# Patient Record
Sex: Female | Born: 1965 | Race: White | Hispanic: No | Marital: Single | Smoking: Never smoker
Health system: Southern US, Community
[De-identification: ages and names within clinical notes are randomized; demographics above are authoritative.]

## PROBLEM LIST (undated history)

## (undated) DIAGNOSIS — F0390 Unspecified dementia without behavioral disturbance: Secondary | ICD-10-CM

## (undated) DIAGNOSIS — Q369 Cleft lip, unilateral: Secondary | ICD-10-CM

## (undated) DIAGNOSIS — T8859XA Other complications of anesthesia, initial encounter: Secondary | ICD-10-CM

## (undated) DIAGNOSIS — N939 Abnormal uterine and vaginal bleeding, unspecified: Secondary | ICD-10-CM

## (undated) DIAGNOSIS — N926 Irregular menstruation, unspecified: Secondary | ICD-10-CM

## (undated) DIAGNOSIS — F32A Depression, unspecified: Secondary | ICD-10-CM

## (undated) DIAGNOSIS — Q909 Down syndrome, unspecified: Secondary | ICD-10-CM

## (undated) DIAGNOSIS — R569 Unspecified convulsions: Secondary | ICD-10-CM

## (undated) DIAGNOSIS — F419 Anxiety disorder, unspecified: Secondary | ICD-10-CM

## (undated) DIAGNOSIS — F329 Major depressive disorder, single episode, unspecified: Secondary | ICD-10-CM

## (undated) DIAGNOSIS — G40822 Epileptic spasms, not intractable, without status epilepticus: Secondary | ICD-10-CM

## (undated) DIAGNOSIS — T4145XA Adverse effect of unspecified anesthetic, initial encounter: Secondary | ICD-10-CM

## (undated) HISTORY — DX: Depression, unspecified: F32.A

## (undated) HISTORY — DX: Major depressive disorder, single episode, unspecified: F32.9

## (undated) HISTORY — DX: Irregular menstruation, unspecified: N92.6

## (undated) HISTORY — DX: Abnormal uterine and vaginal bleeding, unspecified: N93.9

## (undated) HISTORY — PX: EAR EXAMINATION UNDER ANESTHESIA: SHX1482

## (undated) HISTORY — PX: TOOTH EXTRACTION: SUR596

## (undated) HISTORY — DX: Down syndrome, unspecified: Q90.9

## (undated) HISTORY — DX: Epileptic spasms, not intractable, without status epilepticus: G40.822

## (undated) HISTORY — DX: Anxiety disorder, unspecified: F41.9

## (undated) HISTORY — DX: Cleft lip, unilateral: Q36.9

---

## 2004-10-04 ENCOUNTER — Ambulatory Visit: Payer: Self-pay | Admitting: Dentistry

## 2006-02-19 ENCOUNTER — Ambulatory Visit: Payer: Self-pay | Admitting: Family Medicine

## 2011-08-04 ENCOUNTER — Ambulatory Visit: Payer: Self-pay | Admitting: Anesthesiology

## 2011-08-05 ENCOUNTER — Ambulatory Visit: Payer: Self-pay | Admitting: Otolaryngology

## 2012-04-22 ENCOUNTER — Telehealth: Payer: Self-pay

## 2012-04-22 NOTE — Telephone Encounter (Signed)
Per protocol, I called in one refill on Medroxyprogesterone  10 mg tabs # 30 with 0 RF til pt is seen for AEX . Melody Comas A

## 2012-05-20 ENCOUNTER — Telehealth: Payer: Self-pay | Admitting: Obstetrics and Gynecology

## 2012-05-20 NOTE — Telephone Encounter (Signed)
Could not leave msg on number in chart . Seems like a fax number. Also tried to call other number in her chart lm on vm to call back rgd her daughter's rx refill request from [facility] for medroxyprogesterone 10 m,g tab. Sig 1 po qd . Pt needs to make an aex

## 2012-05-31 ENCOUNTER — Telehealth: Payer: Self-pay | Admitting: Obstetrics and Gynecology

## 2012-05-31 NOTE — Telephone Encounter (Signed)
Spoke with pt's group home rgd rx refill request . Made an app with avs on 06/14/2012 @ 10:00. Per deborah approved rx refill and faxed back to the pharmacy. Medroxyprogesterone 10 mg sig 1 po qd with 1 refill. Pt 's group home aware of rx refill and voice understanding. bt cma

## 2012-06-14 ENCOUNTER — Encounter: Payer: Self-pay | Admitting: Obstetrics and Gynecology

## 2012-06-14 ENCOUNTER — Ambulatory Visit (INDEPENDENT_AMBULATORY_CARE_PROVIDER_SITE_OTHER): Payer: PRIVATE HEALTH INSURANCE | Admitting: Obstetrics and Gynecology

## 2012-06-14 VITALS — BP 102/74 | Resp 14 | Ht <= 58 in | Wt 106.0 lb

## 2012-06-14 DIAGNOSIS — Z01419 Encounter for gynecological examination (general) (routine) without abnormal findings: Secondary | ICD-10-CM

## 2012-06-14 DIAGNOSIS — Q909 Down syndrome, unspecified: Secondary | ICD-10-CM

## 2012-06-14 MED ORDER — MEDROXYPROGESTERONE ACETATE 10 MG PO TABS: 10.0000 mg | ORAL_TABLET | Freq: Every day | ORAL | Status: DC

## 2012-06-14 NOTE — Progress Notes (Signed)
Regular Periods: no Mammogram: no  Monthly Breast Ex.: yes Exercise: yes  Tetanus < 10 years: yes Seatbelts: yes  NI. Bladder Functn.: yes Abuse at home: no  Daily BM's: yes Stressful Work: no  Healthy Diet: yes Sigmoid-Colonoscopy: N/A  Calcium: yes Medical problems this year: N/A   LAST PAP:N/A  Contraception:  None               Mammogram:  N/A  PCP: Dr.Anderson  PMH: No Changes  FMH: No Changes  Last Bone Scan: Never.   Subjective:    Hannah Gardner is a 46 y.o. female, G0P0, who presents for an annual exam. See above. The patient has trisomy 34.  She is mentally challenged.  She takes Provera 10 mg each day to control her cycles.  Her caregivers reports that she has no sexual contact.  Prior Hysterectomy: No    History   Social History  . Marital Status: Single    Spouse Name: N/A    Number of Children: N/A  . Years of Education: N/A   Social History Main Topics  . Smoking status: Never Smoker   . Smokeless tobacco: Never Used  . Alcohol Use: No  . Drug Use: No  . Sexually Active: No   Other Topics Concern  . None   Social History Narrative  . None    Menstrual cycle:   LMP: No LMP recorded.           Cycle: irregular menses due to Provera.  The following portions of the patient's history were reviewed and updated as appropriate: allergies, current medications, past family history, past medical history, past social history, past surgical history and problem list.  Review of Systems Pertinent items are noted in HPI. Breast:Negative for breast lump,nipple discharge or nipple retraction Gastrointestinal: Negative for abdominal pain, change in bowel habits or rectal bleeding Urinary:negative   Objective:    BP 102/74  Resp 14  Ht 4' 7.5" (1.41 m)  Wt 106 lb (48.081 kg)  BMI 24.19 kg/m2    Weight:  Wt Readings from Last 1 Encounters:  06/14/12 106 lb (48.081 kg)          BMI: Body mass index is 24.19 kg/(m^2).  General Appearance: Alert,  appropriate appearance for age. No acute distress HEENT: Grossly normal Neck / Thyroid: Supple, no masses, nodes or enlargement Lungs: clear to auscultation bilaterally Back: No CVA tenderness Breast Exam: No masses or nodes.No dimpling, nipple retraction or discharge. Cardiovascular: Regular rate and rhythm. S1, S2, no murmur Gastrointestinal: Soft, non-tender, no masses or organomegaly  ++++++++++++++++++++++++++++++++++++++++++++++++++++++++  Pelvic Exam: Exam deferred. the patient is mentally challenged and she would not allow [place] to do a pelvic exam. We tried on several occasions. Rectovaginal: the patient is mentally challenged and she would not allow [place] to do a rectal exam in spite of our best efforts.  ++++++++++++++++++++++++++++++++++++++++++++++++++++++++  Lymphatic Exam: Non-palpable nodes in neck, clavicular, axillary, or inguinal regions   Assessment:    trisomy 13   Uncooperative with exam  Needs control of menses for sanitation reasons  Overweight or obese: Yes     Plan:    return annually or prn Appropriate forms completed. Contraception:abstinence    STD screen request: No   The updated Pap smear screening guidelines were discussed with the patient. The patient requested that I obtain a Pap smear: No.  Kegel exercises discussed: No.  Provera 10 mg each day.  Mylinda Latina.D.

## 2013-08-15 ENCOUNTER — Emergency Department: Payer: Self-pay | Admitting: Emergency Medicine

## 2014-04-05 DIAGNOSIS — Q909 Down syndrome, unspecified: Secondary | ICD-10-CM | POA: Insufficient documentation

## 2014-04-05 DIAGNOSIS — G309 Alzheimer's disease, unspecified: Secondary | ICD-10-CM

## 2014-04-05 DIAGNOSIS — F0280 Dementia in other diseases classified elsewhere without behavioral disturbance: Secondary | ICD-10-CM | POA: Insufficient documentation

## 2014-05-15 DIAGNOSIS — B351 Tinea unguium: Secondary | ICD-10-CM | POA: Insufficient documentation

## 2014-06-25 ENCOUNTER — Emergency Department: Payer: Self-pay | Admitting: Emergency Medicine

## 2014-06-25 LAB — COMPREHENSIVE METABOLIC PANEL
ALBUMIN: 3.1 g/dL — AB (ref 3.4–5.0)
Alkaline Phosphatase: 79 U/L
Anion Gap: 7 (ref 7–16)
BILIRUBIN TOTAL: 0.2 mg/dL (ref 0.2–1.0)
BUN: 19 mg/dL — ABNORMAL HIGH (ref 7–18)
CALCIUM: 8.3 mg/dL — AB (ref 8.5–10.1)
CREATININE: 1.44 mg/dL — AB (ref 0.60–1.30)
Chloride: 106 mmol/L (ref 98–107)
Co2: 28 mmol/L (ref 21–32)
EGFR (Non-African Amer.): 43 — ABNORMAL LOW
GFR CALC AF AMER: 50 — AB
GLUCOSE: 101 mg/dL — AB (ref 65–99)
Osmolality: 284 (ref 275–301)
POTASSIUM: 4.3 mmol/L (ref 3.5–5.1)
SGOT(AST): 29 U/L (ref 15–37)
SGPT (ALT): 16 U/L
SODIUM: 141 mmol/L (ref 136–145)
TOTAL PROTEIN: 7.4 g/dL (ref 6.4–8.2)

## 2014-06-25 LAB — CBC
HCT: 41.5 % (ref 35.0–47.0)
HGB: 13.6 g/dL (ref 12.0–16.0)
MCH: 33.1 pg (ref 26.0–34.0)
MCHC: 32.7 g/dL (ref 32.0–36.0)
MCV: 101 fL — AB (ref 80–100)
PLATELETS: 250 10*3/uL (ref 150–440)
RBC: 4.1 10*6/uL (ref 3.80–5.20)
RDW: 13 % (ref 11.5–14.5)
WBC: 6.8 10*3/uL (ref 3.6–11.0)

## 2014-11-13 ENCOUNTER — Emergency Department: Payer: Self-pay | Admitting: Emergency Medicine

## 2014-11-23 DIAGNOSIS — R262 Difficulty in walking, not elsewhere classified: Secondary | ICD-10-CM | POA: Insufficient documentation

## 2014-11-23 DIAGNOSIS — R569 Unspecified convulsions: Secondary | ICD-10-CM | POA: Insufficient documentation

## 2014-11-23 DIAGNOSIS — G479 Sleep disorder, unspecified: Secondary | ICD-10-CM | POA: Insufficient documentation

## 2015-04-05 ENCOUNTER — Inpatient Hospital Stay: Admission: RE | Admit: 2015-04-05 | Payer: Self-pay | Source: Ambulatory Visit

## 2015-04-06 DIAGNOSIS — Z79899 Other long term (current) drug therapy: Secondary | ICD-10-CM | POA: Diagnosis not present

## 2015-04-06 DIAGNOSIS — Q909 Down syndrome, unspecified: Secondary | ICD-10-CM | POA: Diagnosis not present

## 2015-04-06 DIAGNOSIS — H6123 Impacted cerumen, bilateral: Secondary | ICD-10-CM | POA: Diagnosis not present

## 2015-04-06 NOTE — Patient Instructions (Addendum)
  Your procedure is scheduled on: April 11, 2015 Report to Same Day Surgery at 7:30 am.   Remember: Instructions that are not followed completely may result in serious medical risk, up to and including death, or upon the discretion of your surgeon and anesthesiologist your surgery may need to be rescheduled.    __x__ 1. Do not eat food or drink liquids after midnight. No gum chewing or hard candies.     ____ 2. No Alcohol for 24 hours before or after surgery.   ____ 3. Bring all medications with you on the day of surgery if instructed.    __x__ 4. Notify your doctor if there is any change in your medical condition     (cold, fever, infections).     Do not wear jewelry, make-up, hairpins, clips or nail polish.  Do not wear lotions, powders, or perfumes. You may wear deodorant.  Do not shave 48 hours prior to surgery. Men may shave face and neck.  Do not bring valuables to the hospital.    [facility] is not responsible for any belongings or valuables.               Contacts, dentures or bridgework may not be worn into surgery.  Leave your suitcase in the car. After surgery it may be brought to your room.  For patients admitted to the hospital, discharge time is determined by your                treatment team.   Patients discharged the day of surgery will not be allowed to drive home.   Please read over the following fact sheets that you were given:      __x_ Take these medicines the morning of surgery with A SIP OF WATER:    1. Ativan  ____ Fleet Enema (as directed)   ____ Use CHG Soap as directed  ____ Use inhalers on the day of surgery  ____ Stop metformin 2 days prior to surgery    ____ Take 1/2 of usual insulin dose the night before surgery and none on the morning of surgery.   ____ Stop Coumadin/Plavix/aspirin on does not apply.  ____ Stop Anti-inflammatories on Does not apply.   ____ Stop supplements until after surgery.    ____ Bring C-Pap to the hospital.

## 2015-04-06 NOTE — Pre-Procedure Instructions (Signed)
This RN spoke with Hannah Gardner at [facility] regarding pt's preop instructions and faxed a copy of said instructions to [facility].

## 2015-04-11 ENCOUNTER — Encounter
Admission: RE | Disposition: A | Discharge status: Other Acute Inpatient Hospital | Payer: Self-pay | Source: Ambulatory Visit | Attending: Otolaryngology

## 2015-04-11 SURGERY — REMOVAL, CERUMEN, IMPACTED
Anesthesia: General | Laterality: Bilateral

## 2015-04-18 ENCOUNTER — Ambulatory Visit: Admission: RE | Admit: 2015-04-18 | Payer: Medicare Other | Source: Ambulatory Visit | Admitting: Otolaryngology

## 2015-04-18 ENCOUNTER — Encounter: Payer: Self-pay | Admitting: *Deleted

## 2015-04-18 ENCOUNTER — Encounter
Admission: RE | Disposition: A | Discharge status: Other Acute Inpatient Hospital | Payer: Self-pay | Source: Ambulatory Visit | Attending: Otolaryngology

## 2015-04-18 ENCOUNTER — Ambulatory Visit
Admission: RE | Admit: 2015-04-18 | Discharge: 2015-04-18 | Payer: Medicare Other | Source: Ambulatory Visit | Attending: Otolaryngology | Admitting: Otolaryngology

## 2015-04-18 DIAGNOSIS — H6123 Impacted cerumen, bilateral: Secondary | ICD-10-CM | POA: Insufficient documentation

## 2015-04-18 DIAGNOSIS — Z79899 Other long term (current) drug therapy: Secondary | ICD-10-CM | POA: Insufficient documentation

## 2015-04-18 DIAGNOSIS — Q909 Down syndrome, unspecified: Secondary | ICD-10-CM | POA: Insufficient documentation

## 2015-04-18 HISTORY — PX: CERUMEN REMOVAL: SHX6571

## 2015-04-18 HISTORY — DX: Adverse effect of unspecified anesthetic, initial encounter: T41.45XA

## 2015-04-18 HISTORY — DX: Other complications of anesthesia, initial encounter: T88.59XA

## 2015-04-18 SURGERY — REMOVAL, CERUMEN, IMPACTED
Anesthesia: General | Laterality: Bilateral | Wound class: Clean Contaminated

## 2015-04-18 MED ORDER — MIDAZOLAM HCL 2 MG/ML PO SYRP
10.0000 mg | ORAL_SOLUTION | Freq: Once | ORAL | Status: AC
  Administered 2015-04-18: 10 mg via ORAL

## 2015-04-18 MED ORDER — DEXAMETHASONE SODIUM PHOSPHATE 4 MG/ML IJ SOLN
8.0000 mg | Freq: Once | INTRAMUSCULAR | Status: DC | PRN
  Filled 2015-04-18: qty 2

## 2015-04-18 MED ORDER — FENTANYL CITRATE (PF) 100 MCG/2ML IJ SOLN: 25.0000 ug | INTRAMUSCULAR | Status: DC | PRN

## 2015-04-18 SURGICAL SUPPLY — 7 items
BLADE MYR LANCE NRW W/HDL (BLADE) IMPLANT
CANISTER SUCT 1200ML W/VALVE (MISCELLANEOUS) ×3 IMPLANT
COTTONBALL LRG STERILE PKG (GAUZE/BANDAGES/DRESSINGS) IMPLANT
GLOVE BIO SURGEON STRL SZ7.5 (GLOVE) ×6 IMPLANT
TOWEL OR 17X26 4PK STRL BLUE (TOWEL DISPOSABLE) ×3 IMPLANT
TUBING CONN 6MMX3.1M (TUBING) ×2
TUBING SUCTION CONN 0.25 STRL (TUBING) ×1 IMPLANT

## 2015-04-18 NOTE — Anesthesia Preprocedure Evaluation (Signed)
Anesthesia Evaluation  Patient identified by MRN, date of birth, ID band Patient awake  General Assessment Comment:Mentally challenged and uncooperative. Hx from family/caregivers. They report no problems with prior anesthetics. NKDA, NPO.  Reviewed: Allergy & Precautions, NPO status , Patient's Chart, lab work & pertinent test results  Airway Mallampati: II  TM Distance: >3 FB Neck ROM: Full    Dental  (+) Teeth Intact   Pulmonary          Cardiovascular     Neuro/Psych    GI/Hepatic   Endo/Other    Renal/GU      Musculoskeletal   Abdominal (+)  Abdomen: soft.    Peds  Hematology   Anesthesia Other Findings   Reproductive/Obstetrics On provera.                             Anesthesia Physical Anesthesia Plan  ASA: III  Anesthesia Plan: General   Post-op Pain Management:    Induction: Inhalational  Airway Management Planned: Mask  Additional Equipment:   Intra-op Plan:   Post-operative Plan:   Informed Consent: I have reviewed the patients History and Physical, chart, labs and discussed the procedure including the risks, benefits and alternatives for the proposed anesthesia with the patient or authorized representative who has indicated his/her understanding and acceptance.     Plan Discussed with: CRNA  Anesthesia Plan Comments:         Anesthesia Quick Evaluation

## 2015-04-18 NOTE — Anesthesia Postprocedure Evaluation (Signed)
  Anesthesia Post-op Note  Patient: Hannah Gardner  Procedure(s) Performed: Procedure(s): CERUMEN REMOVAL, Bilateral  (Bilateral)  Anesthesia type:General  Patient location: PACU  Post pain: Pain level controlled  Post assessment: Post-op Vital signs reviewed, Patient's Cardiovascular Status Stable, Respiratory Function Stable, Patent Airway and No signs of Nausea or vomiting  Post vital signs: Reviewed and stable  Last Vitals:  Filed Vitals:   04/18/15 1057  BP:   Pulse:   Temp: 36.1 C  Resp:     Level of consciousness: awake, alert  and patient cooperative  Complications: No apparent anesthesia complications

## 2015-04-18 NOTE — Transfer of Care (Signed)
Immediate Anesthesia Transfer of Care Note  Patient: Hannah Gardner  Procedure(s) Performed: Procedure(s): CERUMEN REMOVAL, Bilateral  (Bilateral)  Patient Location: PACU  Anesthesia Type:General  Level of Consciousness: sedated and responds to stimulation  Airway & Oxygen Therapy: Patient Spontanous Breathing and Patient connected to face mask oxygen  Post-op Assessment: Report given to RN and Post -op Vital signs reviewed and stable  Post vital signs: Reviewed and stable  Last Vitals:  Filed Vitals:   04/18/15 0911  BP: 106/62  Pulse: 78  Temp: 37.1 C  Resp: 16    Complications: No apparent anesthesia complications

## 2015-04-18 NOTE — Op Note (Signed)
04/18/2015  10:47 AM    Montey HoraSusan Bhatt  [id number]   Pre-Op Diagnosis:  IMPACTED CERUMEN Post-op Diagnosis: IMPACTED CERUMEN  Procedure: Bilateral removal of impacted cerumen under anesthesia Surgeon:  Sandi MealyBennett, Batool Majid S  Anesthesia:  General anesthesia with masked ventilation  EBL:  Minimal  Complications:  None  Findings: Bilateral impacted cerumen. TM's clear.  Procedure: The patient was taken to the Operating Room and placed in the supine position.  After induction of general anesthesia with mask ventilation, the right ear was evaluated under the operating microscope and the canal cleaned of impacted cerumen with a currette. The findings were as described above, the TM was normal.  Attention was then turned to the left ear. The same procedure was then performed on this side in the same fashion.  The patient was then returned to the anesthesiologist for awakening, and was taken to the Recovery Room in stable condition.  Cultures:  None.  Disposition:   PACU then discharge home  Plan: Follow up as needed   Sandi MealyBennett, Shaniya Tashiro S 04/18/2015 10:47 AM

## 2015-04-18 NOTE — H&P (Signed)
History and physical reviewed and will be scanned in later. No change in medical status reported by the patient or family, appears stable for surgery. All questions regarding the procedure answered, and patient (or family if a child) expressed understanding of the procedure.  Hannah Gardner @TODAY@ 

## 2015-04-18 NOTE — Discharge Instructions (Signed)
AMBULATORY SURGERY  °DISCHARGE INSTRUCTIONS ° ° °1) The drugs that you were given will stay in your system until tomorrow so for the next 24 hours you should not: ° °A) Drive an automobile °B) Make any legal decisions °C) Drink any alcoholic beverage ° ° °2) You may resume regular meals tomorrow.  Today it is better to start with liquids and gradually work up to solid foods. ° °You may eat anything you prefer, but it is better to start with liquids, then soup and crackers, and gradually work up to solid foods. ° ° °3) Please notify your doctor immediately if you have any unusual bleeding, trouble breathing, redness and pain at the surgery site, drainage, fever, or pain not relieved by medication. ° °4) Your post-operative visit with Dr.                     °           °     is: Date:                        Time:   ° °Please call to schedule your post-operative visit. ° °5) Additional Instructions: °6)  °

## 2015-04-21 ENCOUNTER — Emergency Department
Admission: EM | Admit: 2015-04-21 | Discharge: 2015-04-22 | Disposition: A | Discharge status: Home / Self Care | Payer: Medicare Other | Attending: Emergency Medicine | Admitting: Emergency Medicine

## 2015-04-21 DIAGNOSIS — R569 Unspecified convulsions: Secondary | ICD-10-CM | POA: Diagnosis not present

## 2015-04-21 DIAGNOSIS — F0390 Unspecified dementia without behavioral disturbance: Secondary | ICD-10-CM | POA: Diagnosis not present

## 2015-04-21 DIAGNOSIS — Q909 Down syndrome, unspecified: Secondary | ICD-10-CM | POA: Diagnosis not present

## 2015-04-21 DIAGNOSIS — N3000 Acute cystitis without hematuria: Secondary | ICD-10-CM | POA: Diagnosis not present

## 2015-04-21 DIAGNOSIS — Z79899 Other long term (current) drug therapy: Secondary | ICD-10-CM | POA: Insufficient documentation

## 2015-04-21 LAB — CBC
HEMATOCRIT: 36.9 % (ref 35.0–47.0)
HEMOGLOBIN: 12.4 g/dL (ref 12.0–16.0)
MCH: 33.5 pg (ref 26.0–34.0)
MCHC: 33.6 g/dL (ref 32.0–36.0)
MCV: 99.7 fL (ref 80.0–100.0)
PLATELETS: 229 10*3/uL (ref 150–440)
RBC: 3.7 MIL/uL — AB (ref 3.80–5.20)
RDW: 13.3 % (ref 11.5–14.5)
WBC: 5.7 10*3/uL (ref 3.6–11.0)

## 2015-04-21 LAB — URINALYSIS COMPLETE WITH MICROSCOPIC (ARMC ONLY)
Bacteria, UA: NONE SEEN
Bilirubin Urine: NEGATIVE
Glucose, UA: NEGATIVE mg/dL
HGB URINE DIPSTICK: NEGATIVE
Ketones, ur: NEGATIVE mg/dL
Nitrite: NEGATIVE
PH: 7 (ref 5.0–8.0)
PROTEIN: NEGATIVE mg/dL
Specific Gravity, Urine: 1.002 — ABNORMAL LOW (ref 1.005–1.030)

## 2015-04-21 MED ORDER — LEVETIRACETAM 500 MG PO TABS
ORAL_TABLET | ORAL | Status: AC
Start: 2015-04-21 — End: 2015-04-21
  Administered 2015-04-21: 1000 mg via ORAL
  Filled 2015-04-21: qty 2

## 2015-04-21 MED ORDER — LORAZEPAM 1 MG PO TABS
ORAL_TABLET | ORAL | Status: AC
  Administered 2015-04-21: 1 mg via ORAL
  Filled 2015-04-21: qty 1

## 2015-04-21 MED ORDER — LEVETIRACETAM 500 MG PO TABS: 500.0000 mg | ORAL_TABLET | Freq: Two times a day (BID) | ORAL | Status: DC

## 2015-04-21 MED ORDER — KETAMINE HCL 50 MG/ML IJ SOLN: 40.0000 mg | Freq: Once | INTRAMUSCULAR | Status: DC

## 2015-04-21 MED ORDER — KETAMINE HCL 50 MG/ML IJ SOLN
INTRAMUSCULAR | Status: AC
  Filled 2015-04-21: qty 10

## 2015-04-21 MED ORDER — LORAZEPAM 1 MG PO TABS
1.0000 mg | ORAL_TABLET | Freq: Once | ORAL | Status: AC
  Administered 2015-04-21: 1 mg via ORAL

## 2015-04-21 MED ORDER — LEVETIRACETAM 500 MG PO TABS
1000.0000 mg | ORAL_TABLET | Freq: Once | ORAL | Status: AC
  Administered 2015-04-21: 1000 mg via ORAL

## 2015-04-21 NOTE — ED Notes (Signed)
Consent signed by mother

## 2015-04-21 NOTE — ED Notes (Signed)
Pt to ED from [facility] with mother and caregiver c/o seizures.  Pt has hx of down syndrome and dementia.  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  Caregiver states pt had 3 minutes seizure around 1830 at group home, reported that pt head turned to the side and fell sideways onto couch with muscle shaking activity.  Reported that pt was more agitated post seizure.  According to caregiver pt is acting at baseline now.  Caregiver states pt not showing signs of pain.

## 2015-04-21 NOTE — ED Notes (Signed)
Pt presents after having a seizure that lasted approx 3 minutes. Pt is currently being evaluated by neurology and no definite dx at this time. Unable to obtain vitals due to pt agitation. Per Lum Keas, RN consultant for pt's home, pt is usually premedicated prior to MD appts. No medication given prior to arrival.

## 2015-04-21 NOTE — ED Provider Notes (Signed)
[facility] Emergency Department Provider Note  Time seen: 9:36 PM  I have reviewed the triage vital signs and the nursing notes.   HISTORY  Chief Complaint Seizures    HPI Hannah Gardner is a 49 y.o. female with a past medical history of Down syndrome, dementia who presents the emergency department with a possible seizure. According to mom and a group home caretaker the patient had a history of seizures as a child, but outgrew them. However in the past 1 year this is now the third seizure the patient has had. The patient has a history of dementia and Down's syndrome which per mom has been making her neurologic workup very difficult as the patient does not tolerate medical procedures well. She had to go under general anesthesia last week to have the earwax removed from her ear. Patient cannot provide her own history. Patient was treated with Ativan prior to arrival, to help attempt to keep her calm. Patient is not currently on any seizure medications.     Past Medical History  Diagnosis Date  . Irregular periods/menstrual cycles   . Down's syndrome   . Cleft lip   . Hypsarrhythmia   . Complication of anesthesia     pt was given 2 shots for sedation for a past surgery that made pt aggressive.  Mom went back with pt for last surgery, pt did well.    Patient Active Problem List   Diagnosis Date Noted  . 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 Ear examination under anesthesia Bilateral     remove cerumen from ears  . Cerumen removal Bilateral 04/18/2015    Procedure: CERUMEN REMOVAL, Bilateral ;  Surgeon: Geanie Logan, MD;  Location: [facility] ORS;  Service: ENT;  Laterality: Bilateral;    Current Outpatient Rx  Name  Route  Sig  Dispense  Refill  . acetaminophen (TYLENOL) 325 MG tablet   Oral   Take 325 mg by mouth every 4 (four) hours as needed for mild pain or fever.         Marland Kitchen aluminum-magnesium hydroxide-simethicone  (MAALOX) 200-200-20 MG/5ML SUSP   Oral   Take 30 mLs by mouth every 4 (four) hours as needed (upset stomach).         . Calcium Carb-Cholecalciferol (CALCIUM 600 + D PO)   Oral   Take 1 tablet by mouth daily.         . diphenhydrAMINE (BENADRYL) 25 mg capsule   Oral   Take 25 mg by mouth every 4 (four) hours as needed for allergies.         Marland Kitchen donepezil (ARICEPT) 5 MG tablet   Oral   Take 5 mg by mouth 2 (two) times daily.          Marland Kitchen econazole nitrate 1 % cream   Topical   Apply 1 application topically as needed.         Marland Kitchen escitalopram (LEXAPRO) 10 MG tablet   Oral   Take 10 mg by mouth 2 (two) times daily.         . hydrocortisone 2.5 % cream   Topical   Apply 1 application topically as needed.         . hydrogen peroxide 1.5 % SOLN   Topical   Apply 4 drops topically as needed (to clean ears).         . loperamide (IMODIUM) 2 MG capsule   Oral  Take 4 mg by mouth as needed for diarrhea or loose stools.         Marland Kitchen LORazepam (ATIVAN) 0.5 MG tablet   Oral   Take 0.5 mg by mouth every 6 (six) hours as needed for anxiety (aggitation, transportation).          . LORazepam (ATIVAN) 1 MG tablet   Oral   Take 1 mg by mouth as needed (testing, MD visits, nail care).          . magnesium hydroxide (MILK OF MAGNESIA) 400 MG/5ML suspension   Oral   Take 30 mLs by mouth daily as needed for mild constipation or moderate constipation.         . Memantine HCl-Donepezil HCl 28-10 MG CP24   Oral   Take 1 tablet by mouth daily.         . Multiple Vitamin (MULTIVITAMIN WITH MINERALS) TABS tablet   Oral   Take 1 tablet by mouth daily.         . NON FORMULARY   Topical   Apply 1 application topically daily as needed (dry skin). DML forte cream         . senna (SENOKOT) 8.6 MG tablet   Oral   Take 2 tablets by mouth as needed for constipation.         . traZODone (DESYREL) 50 MG tablet   Oral   Take 50 mg by mouth at bedtime as needed for  sleep.           Allergies Review of patient's allergies indicates no known allergies.  No family history on file.  Social History History  Substance Use Topics  . Smoking status: Never Smoker   . Smokeless tobacco: Never Used  . Alcohol Use: No    Review of Systems Unable to obtain a review systems due to dementia and Down's syndrome.  ____________________________________________   PHYSICAL EXAM:  VITAL SIGNS: ED Triage Vitals  Enc Vitals Group     BP --      Pulse --      Resp --      Temp --      Temp src --      SpO2 --      Weight --      Height --      Head Cir --      Peak Flow --      Pain Score --      Pain Loc --      Pain Edu? --      Excl. in GC? --     Constitutional: Alert and oriented. Well appearing and in no distress. ENT   Head: Normocephalic and atraumatic.   Mouth/Throat: Mucous membranes are moist. Cardiovascular: Normal rate, regular rhythm.  Respiratory: Normal respiratory effort without tachypnea nor retractions. Breath sounds are clear  Gastrointestinal: Soft and nontender. No distention.  Musculoskeletal: Nontender with normal range of motion in all extremities. Neurologic:  Normal speech and language. No gross focal neurologic deficits Psychiatric: Mood and affect are normal. Speech and behavior are normal.   ____________________________________________    INITIAL IMPRESSION / ASSESSMENT AND PLAN / ED COURSE  Pertinent labs & imaging results that were available during my care of the patient were reviewed by me and considered in my medical decision making (see chart for details).  Patient presents with a likely 3 minute tonic-clonic seizure at her group home. Per mom this is the patient's third seizure in the  past 1 year. Patient appears well currently, and at her baseline per mom. We will check labs in the emergency department. I discussed this with mom who is agreeable, although she states the patient will fight  "ferociously." They will attempt to have the patient urinate in the hat in the toilet. If the patient is unable to urinate, and will require an in and out catheterization we will likely proceed with a conscious sedation to obtain medical workup.  Patient unable to urinate on her own. I discussed the options with mom, they have decided that they would rather physically restrain the patient to obtain an in and out urinalysis and blood work instead of IM sedation. We will obtain labs, and urinalysis to further workup/evaluate. I discussed with Dr. Doran Clay.  He recommends starting the patient on Keppra as long as her creatinine is within normal limits, he recommends starting 1000 mg load in the department and to be discharged on 500 mg tablet by mouth twice a day.  ____________________________________________   FINAL CLINICAL IMPRESSION(S) / ED DIAGNOSES  Seizure   Minna Antis, MD 04/21/15 [phone]

## 2015-04-22 DIAGNOSIS — R569 Unspecified convulsions: Secondary | ICD-10-CM | POA: Diagnosis not present

## 2015-04-22 LAB — COMPREHENSIVE METABOLIC PANEL
ALBUMIN: 3 g/dL — AB (ref 3.5–5.0)
ALK PHOS: 82 U/L (ref 38–126)
ALT: 12 U/L — ABNORMAL LOW (ref 14–54)
AST: 22 U/L (ref 15–41)
Anion gap: 6 (ref 5–15)
BILIRUBIN TOTAL: 0.3 mg/dL (ref 0.3–1.2)
BUN: 15 mg/dL (ref 6–20)
CO2: 30 mmol/L (ref 22–32)
Calcium: 8.5 mg/dL — ABNORMAL LOW (ref 8.9–10.3)
Chloride: 102 mmol/L (ref 101–111)
Creatinine, Ser: 0.94 mg/dL (ref 0.44–1.00)
GLUCOSE: 85 mg/dL (ref 65–99)
Potassium: 3.9 mmol/L (ref 3.5–5.1)
Sodium: 138 mmol/L (ref 135–145)
Total Protein: 6.6 g/dL (ref 6.5–8.1)

## 2015-04-22 MED ORDER — SULFAMETHOXAZOLE-TRIMETHOPRIM 800-160 MG PO TABS: 1.0000 | ORAL_TABLET | Freq: Two times a day (BID) | ORAL | Status: DC

## 2015-04-22 MED ORDER — SULFAMETHOXAZOLE-TRIMETHOPRIM 800-160 MG PO TABS
1.0000 | ORAL_TABLET | Freq: Once | ORAL | Status: AC
  Administered 2015-04-22: 1 via ORAL

## 2015-04-22 MED ORDER — SULFAMETHOXAZOLE-TRIMETHOPRIM 800-160 MG PO TABS
ORAL_TABLET | ORAL | Status: AC
  Administered 2015-04-22: 1 via ORAL
  Filled 2015-04-22: qty 1

## 2015-04-22 NOTE — Discharge Instructions (Signed)
1. Start Keppra 500 mg twice daily for seizures (#60). 2. Take antibiotics as prescribed for UTI (Septra DS twice daily 7 days). 3. Return to the ER for recurrent or worsening symptoms, fever, persistent vomiting or other concerns.  Epilepsy Epilepsy is a disorder in which a person has repeated seizures over time. A seizure is a release of abnormal electrical activity in the brain. Seizures can cause a change in attention, behavior, or the ability to remain awake and alert (altered mental status). Seizures often involve uncontrollable shaking (convulsions).  Most people with epilepsy lead normal lives. However, people with epilepsy are at an increased risk of falls, accidents, and injuries. Therefore, it is important to begin treatment right away. CAUSES  Epilepsy has many possible causes. Anything that disturbs the normal pattern of brain cell activity can lead to seizures. This may include:   Head injury.  Birth trauma.  High fever as a child.  Stroke.  Bleeding into or around the brain.  Certain drugs.  Prolonged low oxygen, such as what occurs after CPR efforts.  Abnormal brain development.  Certain illnesses, such as meningitis, encephalitis (brain infection), malaria, and other infections.  An imbalance of nerve signaling chemicals (neurotransmitters).  SIGNS AND SYMPTOMS  The symptoms of a seizure can vary greatly from one person to another. Right before a seizure, you may have a warning (aura) that a seizure is about to occur. An aura may include the following symptoms:  Fear or anxiety.  Nausea.  Feeling like the room is spinning (vertigo).  Vision changes, such as seeing flashing lights or spots. Common symptoms during a seizure include:  Abnormal sensations, such as an abnormal smell or a bitter taste in the mouth.   Sudden, general body stiffness.   Convulsions that involve rhythmic jerking of the face, arm, or leg on one or both sides.   Sudden change  in consciousness.   Appearing to be awake but not responding.   Appearing to be asleep but cannot be awakened.   Grimacing, chewing, lip smacking, drooling, tongue biting, or loss of bowel or bladder control. After a seizure, you may feel sleepy for a while. DIAGNOSIS  Your health care provider will ask about your symptoms and take a medical history. Descriptions from any witnesses to your seizures will be very helpful in the diagnosis. A physical exam, including a detailed neurological exam, is necessary. Various tests may be done, such as:   An electroencephalogram (EEG). This is a painless test of your brain waves. In this test, a diagram is created of your brain waves. These diagrams can be interpreted by a specialist.  An MRI of the brain.   A CT scan of the brain.   A spinal tap (lumbar puncture, LP).  Blood tests to check for signs of infection or abnormal blood chemistry. TREATMENT  There is no cure for epilepsy, but it is generally treatable. Once epilepsy is diagnosed, it is important to begin treatment as soon as possible. For most people with epilepsy, seizures can be controlled with medicines. The following may also be used:  A pacemaker for the brain (vagus nerve stimulator) can be used for people with seizures that are not well controlled by medicine.  Surgery on the brain. For some people, epilepsy eventually goes away. HOME CARE INSTRUCTIONS   Follow your health care provider's recommendations on driving and safety in normal activities.  Get enough rest. Lack of sleep can cause seizures.  Only take over-the-counter or prescription medicines as  directed by your health care provider. Take any prescribed medicine exactly as directed.  Avoid any known triggers of your seizures.  Keep a seizure diary. Record what you recall about any seizure, especially any possible trigger.   Make sure the people you live and work with know that you are prone to seizures.  They should receive instructions on how to help you. In general, a witness to a seizure should:   Cushion your head and body.   Turn you on your side.   Avoid unnecessarily restraining you.   Not place anything inside your mouth.   Call for emergency medical help if there is any question about what has occurred.   Follow up with your health care provider as directed. You may need regular blood tests to monitor the levels of your medicine.  SEEK MEDICAL CARE IF:   You develop signs of infection or other illness. This might increase the risk of a seizure.   You seem to be having more frequent seizures.   Your seizure pattern is changing.  SEEK IMMEDIATE MEDICAL CARE IF:   You have a seizure that does not stop after a few moments.   You have a seizure that causes any difficulty in breathing.   You have a seizure that results in a very severe headache.   You have a seizure that leaves you with the inability to speak or use a part of your body.  Document Released: 10/27/2005 Document Revised: 08/17/2013 Document Reviewed: 06/08/2013 [facility] Patient Information 2015 [place], [place]. This information is not intended to replace advice given to you by your health care provider. Make sure you discuss any questions you have with your health care provider.  Urinary Tract Infection Urinary tract infections (UTIs) can develop anywhere along your urinary tract. Your urinary tract is your body's drainage system for removing wastes and extra water. Your urinary tract includes two kidneys, two ureters, a bladder, and a urethra. Your kidneys are a pair of bean-shaped organs. Each kidney is about the size of your fist. They are located below your ribs, one on each side of your spine. CAUSES Infections are caused by microbes, which are microscopic organisms, including fungi, viruses, and bacteria. These organisms are so small that they can only be seen through a microscope. Bacteria are  the microbes that most commonly cause UTIs. SYMPTOMS  Symptoms of UTIs may vary by age and gender of the patient and by the location of the infection. Symptoms in young women typically include a frequent and intense urge to urinate and a painful, burning feeling in the bladder or urethra during urination. Older women and men are more likely to be tired, shaky, and weak and have muscle aches and abdominal pain. A fever may mean the infection is in your kidneys. Other symptoms of a kidney infection include pain in your back or sides below the ribs, nausea, and vomiting. DIAGNOSIS To diagnose a UTI, your caregiver will ask you about your symptoms. Your caregiver also will ask to provide a urine sample. The urine sample will be tested for bacteria and white blood cells. White blood cells are made by your body to help fight infection. TREATMENT  Typically, UTIs can be treated with medication. Because most UTIs are caused by a bacterial infection, they usually can be treated with the use of antibiotics. The choice of antibiotic and length of treatment depend on your symptoms and the type of bacteria causing your infection. HOME CARE INSTRUCTIONS  If you were  prescribed antibiotics, take them exactly as your caregiver instructs you. Finish the medication even if you feel better after you have only taken some of the medication.  Drink enough water and fluids to keep your urine clear or pale yellow.  Avoid caffeine, tea, and carbonated beverages. They tend to irritate your bladder.  Empty your bladder often. Avoid holding urine for long periods of time.  Empty your bladder before and after sexual intercourse.  After a bowel movement, women should cleanse from front to back. Use each tissue only once. SEEK MEDICAL CARE IF:   You have back pain.  You develop a fever.  Your symptoms do not begin to resolve within 3 days. SEEK IMMEDIATE MEDICAL CARE IF:   You have severe back pain or lower abdominal  pain.  You develop chills.  You have nausea or vomiting.  You have continued burning or discomfort with urination. MAKE SURE YOU:   Understand these instructions.  Will watch your condition.  Will get help right away if you are not doing well or get worse. Document Released: 08/06/2005 Document Revised: 04/27/2012 Document Reviewed: 12/05/2011 [facility] Patient Information 2015 [place], [place]. This information is not intended to replace advice given to you by your health care provider. Make sure you discuss any questions you have with your health care provider.

## 2015-04-22 NOTE — ED Provider Notes (Signed)
-----------------------------------------   1:47 AM on 04/22/2015 -----------------------------------------  Patient resting in no acute distress. Family eager for discharge. Labs notable for UTI. Will start patient on antibiotics in addition to Keppra as recommended by neurology. Return precautions given. Family verbalizes understanding and agree with plan of care.  Irean Hong, MD 04/22/15 [phone]

## 2015-04-22 NOTE — ED Notes (Signed)
Pt refused vital signs.

## 2015-08-01 ENCOUNTER — Other Ambulatory Visit: Payer: Self-pay

## 2015-08-01 MED ORDER — MEDROXYPROGESTERONE ACETATE 10 MG PO TABS: 10.0000 mg | ORAL_TABLET | Freq: Every day | ORAL | Status: DC

## 2015-08-03 ENCOUNTER — Encounter: Payer: Self-pay | Admitting: Emergency Medicine

## 2015-08-03 ENCOUNTER — Emergency Department
Admission: EM | Admit: 2015-08-03 | Discharge: 2015-08-03 | Disposition: A | Discharge status: Home / Self Care | Payer: Medicare Other | Attending: Emergency Medicine | Admitting: Emergency Medicine

## 2015-08-03 DIAGNOSIS — Z792 Long term (current) use of antibiotics: Secondary | ICD-10-CM | POA: Insufficient documentation

## 2015-08-03 DIAGNOSIS — Z7952 Long term (current) use of systemic steroids: Secondary | ICD-10-CM | POA: Diagnosis not present

## 2015-08-03 DIAGNOSIS — R21 Rash and other nonspecific skin eruption: Secondary | ICD-10-CM

## 2015-08-03 DIAGNOSIS — L239 Allergic contact dermatitis, unspecified cause: Secondary | ICD-10-CM | POA: Diagnosis not present

## 2015-08-03 DIAGNOSIS — Z79899 Other long term (current) drug therapy: Secondary | ICD-10-CM | POA: Insufficient documentation

## 2015-08-03 MED ORDER — PREDNISONE 20 MG PO TABS
40.0000 mg | ORAL_TABLET | Freq: Once | ORAL | Status: AC
  Administered 2015-08-03: 40 mg via ORAL
  Filled 2015-08-03: qty 2

## 2015-08-03 MED ORDER — DIPHENHYDRAMINE HCL 25 MG PO CAPS
25.0000 mg | ORAL_CAPSULE | Freq: Once | ORAL | Status: AC
  Administered 2015-08-03 (×2): 25 mg via ORAL
  Filled 2015-08-03: qty 1

## 2015-08-03 MED ORDER — PREDNISONE 10 MG PO TABS: ORAL_TABLET | ORAL | Status: DC

## 2015-08-03 NOTE — ED Notes (Signed)
Last week started lamictal, now having rash.  No resp distress

## 2015-08-03 NOTE — Discharge Instructions (Signed)
FOLLOW UP WITH DR. ANDERSON IF ANY CONTINUED PROBLEMS  BENADRYL 25 MG EVERY 6 HOURS AS NEEDED FOR ITCHING PREDNISONE   3 TABLETS ONCE A DAY FOR 3 DAYS STARTING TOMORROW MORNING. RETURN TO ER IF ANY SEVERE WORSENING OF HER SYMPTOMS

## 2015-08-03 NOTE — ED Provider Notes (Signed)
[facility] Emergency Department Provider Note  ____________________________________________  Time seen: Approximately 1:57 PM  I have reviewed the triage vital signs and the nursing notes.   HISTORY  Chief Complaint Rash  all history was obtained through caregivers  HPI Hannah Gardner is a 49 y.o. female patient is brought in today with complaint of rash. Patient has severeDown's syndrome but has 2 caregivers with her that are currently giving her history and information. Patient was put on Lamictal on 9/12 100 mg daily and then increase to twice a day starting on 9/19. Caregivers noticed a rash on the right arm that started on 9/21. They state that the rash is worse today and now has places on her chest as well as left arm. Patient is been sitting scratching at these areas frequently. Patient is unable to verbalize. Caregivers are unaware of any fever or chills. She has continued without any difficulty in swallowing or breathing. Patient frequently screams due to sensitivity and being touched. Last evening at the group time she was given a one-time dose of Benadryl and they have been trying topical cortisone cream without any relief. Her doctor advised him to stop the Lamictal but to come to the emergency room for the rash.   Past Medical History  Diagnosis Date  . Irregular periods/menstrual cycles   . Down's syndrome   . Cleft lip   . Hypsarrhythmia   . Complication of anesthesia     pt was given 2 shots for sedation for a past surgery that made pt aggressive.  Mom went back with pt for last surgery, pt did well.    Patient Active Problem List   Diagnosis Date Noted  . 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 Ear examination under anesthesia Bilateral     remove cerumen from ears  . Cerumen removal Bilateral 04/18/2015    Procedure: CERUMEN REMOVAL, Bilateral ;  Surgeon: Geanie Logan, MD;  Location: [facility] ORS;  Service:  ENT;  Laterality: Bilateral;    Current Outpatient Rx  Name  Route  Sig  Dispense  Refill  . acetaminophen (TYLENOL) 325 MG tablet   Oral   Take 650 mg by mouth every 4 (four) hours as needed for mild pain or fever.          Marland Kitchen aluminum-magnesium hydroxide-simethicone (MAALOX) 200-200-20 MG/5ML SUSP   Oral   Take 30 mLs by mouth every 4 (four) hours as needed (upset stomach).         . calamine lotion   Topical   Apply 1 application topically as needed for itching.         . Calcium Carb-Cholecalciferol (CALCIUM 600 + D PO)   Oral   Take 1 tablet by mouth daily.         . diphenhydrAMINE (BENADRYL) 25 mg capsule   Oral   Take 12.5 mg by mouth every 4 (four) hours as needed for allergies.          Marland Kitchen donepezil (ARICEPT) 5 MG tablet   Oral   Take 5 mg by mouth 2 (two) times daily.          Marland Kitchen escitalopram (LEXAPRO) 10 MG tablet   Oral   Take 10 mg by mouth 2 (two) times daily.         . hydrocerin (EUCERIN) CREA   Topical   Apply 1 application topically at bedtime.         Marland Kitchen  hydrocortisone 2.5 % cream   Topical   Apply 1 application topically as needed.         Marland Kitchen LORazepam (ATIVAN) 1 MG tablet   Oral   Take 1 mg by mouth as needed (testing, MD visits, nail care).          . magnesium hydroxide (MILK OF MAGNESIA) 400 MG/5ML suspension   Oral   Take 30 mLs by mouth every three (3) days as needed for mild constipation or moderate constipation.          . medroxyPROGESTERone (PROVERA) 10 MG tablet   Oral   Take 1 tablet (10 mg total) by mouth daily.   30 tablet   1     Pt needs AE for further refills   . memantine (NAMENDA XR) 28 MG CP24 24 hr capsule   Oral   Take 28 mg by mouth at bedtime.         . Memantine HCl-Donepezil HCl 28-10 MG CP24   Oral   Take 1 tablet by mouth daily.         . Multiple Vitamin (MULTIVITAMIN WITH MINERALS) TABS tablet   Oral   Take 1 tablet by mouth daily.         . Calcium Carbonate-Vitamin D  (CALCIUM-VITAMIN D) 500-200 MG-UNIT per tablet   Oral   Take 1 tablet by mouth 2 (two) times daily.         Marland Kitchen econazole nitrate 1 % cream   Topical   Apply 1 application topically 2 (two) times daily. To feet/ toes for one month         . hydrogen peroxide 1.5 % SOLN   Topical   Apply 4 drops topically as needed (to clean ears).         . levETIRAcetam (KEPPRA) 500 MG tablet   Oral   Take 1 tablet (500 mg total) by mouth 2 (two) times daily.   60 tablet   0   . loperamide (IMODIUM) 2 MG capsule   Oral   Take 4 mg by mouth as needed for diarrhea or loose stools.         Marland Kitchen LORazepam (ATIVAN) 0.5 MG tablet   Oral   Take 0.5 mg by mouth every 6 (six) hours as needed for anxiety (aggitation, transportation).          . neomycin-polymyxin-pramoxine (NEOSPORIN PLUS) 1 % cream   Topical   Apply 1 application topically as needed (for skin tears).         . NON FORMULARY   Topical   Apply 1 application topically daily as needed (dry skin). DML forte cream         . predniSONE (DELTASONE) 10 MG tablet      Take 3 tablets once a day for 3 days   9 tablet   0   . PSEUDOEPH-CHLORPHEN-DM PO   Oral   Take 10 mLs by mouth every 6 (six) hours as needed (for cold symptoms).         . senna (SENOKOT) 8.6 MG tablet   Oral   Take 2 tablets by mouth as needed for constipation.         . sulfamethoxazole-trimethoprim (BACTRIM DS,SEPTRA DS) 800-160 MG per tablet   Oral   Take 1 tablet by mouth 2 (two) times daily.   14 tablet   0   . Talc (BABY POWDER EX)   Apply externally   Apply 1 application topically  daily as needed (for moisture protection).         . traZODone (DESYREL) 50 MG tablet   Oral   Take 50 mg by mouth at bedtime as needed for sleep. May have a 2nd dose if wakes up between 0000 and 0000           Allergies Review of patient's allergies indicates no known allergies.  History reviewed. No pertinent family history.  Social History Social  History  Substance Use Topics  . Smoking status: Never Smoker   . Smokeless tobacco: Never Used  . Alcohol Use: No    Review of Systems Constitutional: No fever/chills known ENT: No sore throat known Cardiovascular: No evidence of chest pain Respiratory: No evidence of shortness of breath or difficulty breathing Gastrointestinal: , no vomiting.  No diarrhea.  No constipation. Genitourinary: Negative for dysuria. Musculoskeletal: Negative for back pain. Skin: Positive for rash. Neurological: Negative for headaches, focal weakness or numbness.  10-point ROS otherwise negative.  ____________________________________________   PHYSICAL EXAM:  VITAL SIGNS: ED Triage Vitals  Enc Vitals Group     BP --      Pulse --      Resp --      Temp --      Temp src --      SpO2 --      Weight 08/03/15 1346 105 lb (47.628 kg)     Height --      Head Cir --      Peak Flow --      Pain Score 08/03/15 1310 0     Pain Loc --      Pain Edu? --      Excl. in GC? --     Constitutional: Alert and oriented. Well appearing and in no acute distress. Eyes: Conjunctivae are normal. PERRL. EOMI. Head: Atraumatic. Nose: No congestion/rhinnorhea. Mouth/Throat: Not visualized Neck: No stridor.  Cardiovascular: Normal rate, regular rhythm. Grossly normal heart sounds.  Good peripheral circulation. Respiratory: Normal respiratory effort.  No retractions. Lungs CTAB. Gastrointestinal: Soft and nontender. No distention Musculoskeletal: No lower extremity tenderness nor edema.  No joint effusions. Neurologic:  Normal speech and language. No gross focal neurologic deficits are appreciated. No gait instability. Skin:  Skin is warm, dry and intact. Diffuse papular erythematous rash upper extremities, face and upper chest. There is one macular area right anterior chest. No drainage from these areas. No warmth was noted. Patient was noted scratching frequently in these areas. There was no involvement on the  abdomen, back, or lower extremities. Psychiatric: Mood and affect are normal. Speech and behavior are normal.  ____________________________________________   LABS (all labs ordered are listed, but only abnormal results are displayed)  Labs Reviewed - No data to display  PROCEDURES  Procedure(s) performed: None  Critical Care performed: No  ____________________________________________   INITIAL IMPRESSION / ASSESSMENT AND PLAN / ED COURSE  Pertinent labs & imaging results that were available during my care of the patient were reviewed by me and considered in my medical decision making (see chart for details).  Patient was given Benadryl while in the emergency room along with prednisone. This was given in tablet form and put into applesauce which the patient took from the caregivers. Prescription was given for prednisone 40 mg for the next 3 days beginning tomorrow and to continue Benadryl every 6 hours as needed for itching. Questionable allergic reaction to Lamictal. Caregivers are aware that they may bring patient back if any continued problems  or urgent concerns. ____________________________________________   FINAL CLINICAL IMPRESSION(S) / ED DIAGNOSES  Final diagnoses:  Rash and nonspecific skin eruption  Allergic dermatitis      Tommi Rumps, PA-C 08/03/15 1435  Minna Antis, MD 08/03/15 [phone]

## 2015-09-28 ENCOUNTER — Other Ambulatory Visit: Payer: Self-pay

## 2015-09-28 MED ORDER — MEDROXYPROGESTERONE ACETATE 10 MG PO TABS
10.0000 mg | ORAL_TABLET | Freq: Every day | ORAL | Status: DC
Start: 2015-09-28 — End: 2015-10-31

## 2015-10-31 ENCOUNTER — Other Ambulatory Visit: Payer: Self-pay

## 2015-10-31 MED ORDER — MEDROXYPROGESTERONE ACETATE 10 MG PO TABS
10.0000 mg | ORAL_TABLET | Freq: Every day | ORAL | Status: DC
Start: 2015-10-31 — End: 2015-11-15

## 2015-11-15 ENCOUNTER — Ambulatory Visit (INDEPENDENT_AMBULATORY_CARE_PROVIDER_SITE_OTHER): Payer: Medicare Other | Admitting: Obstetrics and Gynecology

## 2015-11-15 ENCOUNTER — Encounter: Payer: Self-pay | Admitting: Obstetrics and Gynecology

## 2015-11-15 VITALS — BP 108/66 | HR 65 | Ht <= 58 in | Wt 108.0 lb

## 2015-11-15 DIAGNOSIS — N939 Abnormal uterine and vaginal bleeding, unspecified: Secondary | ICD-10-CM | POA: Diagnosis not present

## 2015-11-15 DIAGNOSIS — Z0000 Encounter for general adult medical examination without abnormal findings: Secondary | ICD-10-CM

## 2015-11-15 DIAGNOSIS — Z01419 Encounter for gynecological examination (general) (routine) without abnormal findings: Secondary | ICD-10-CM

## 2015-11-15 DIAGNOSIS — Q909 Down syndrome, unspecified: Secondary | ICD-10-CM

## 2015-11-15 MED ORDER — MEDROXYPROGESTERONE ACETATE 10 MG PO TABS: 10.0000 mg | ORAL_TABLET | Freq: Every day | ORAL | Status: DC

## 2015-11-15 NOTE — Progress Notes (Signed)
Chief complaint: 1.  Annual gynecologic physical.  The patient is a 50 year old [place] white female, para 0, with trisomy 2521, on Provera 10 mg daily for maintenance of amenorrhea and hygiene, not sexually active, who presents for gynecologic physical. There is no history of abnormal uterine bleeding over the past year. No history of STI. No history of vaginal discharge for perineal irritation.   The patient has not had any recent mammogram. No recent Pap smear.  Past Medical History  Diagnosis Date  . Irregular periods/menstrual cycles   . Down's syndrome   . Cleft lip   . Hypsarrhythmia (HCC)   . Complication of anesthesia     pt was given 2 shots for sedation for a past surgery that made pt aggressive.  Mom went back with pt for last surgery, pt did well.  . Down syndrome   . Anxiety   . Depression   . Abnormal uterine bleeding (AUB)    Past Surgical History  Procedure Laterality Date  . Ear examination under anesthesia Bilateral     remove cerumen from ears  . Cerumen removal Bilateral 04/18/2015    Procedure: CERUMEN REMOVAL, Bilateral ;  Surgeon: Geanie LoganPaul Bennett, MD;  Location: [facility] ORS;  Service: ENT;  Laterality: Bilateral;  . Tooth extraction      Review of systems: Not obtainable from patient.  OBJECTIVE: BP 108/66 mmHg  Pulse 65  Ht 4\' 10"  (1.473 m)  Wt 108 lb (48.988 kg)  BMI 22.58 kg/m2 Noncompliant white female in no acute distress. Neck: Nontender, no organomegaly or lymphadenopathy. Lungs: Clear. Heart: Regular rate and rhythm without murmur. Abdomen: Soft, nontender, without organomegaly. Pelvic:  External genitalia-normal.  BUS-normal.  Vagina-unable to examine.  Bimanual exam-unable to perform.  Rectovaginal-normal External exam EXTREMITIES: Without edema; Clubbing present Skin: No rash, no ulceration  ASSESSMENT: 1.  Annual gynecologic exam without abnormal findings. 2.  Trisomy 21. 3.  History of abnormal uterine bleeding; amenorrhea on Provera  10 mg daily.  PLAN: 1.  Continue Provera 10 mg daily through age 50. 2.  Encourage mammogram screening (.  No recent history of study being performed). 3.  Return in 1 year or as needed.  If gynecologic problems develop.  Herold HarmsMartin A Defrancesco, MD  Note: This dictation was prepared with Dragon dictation along with smaller phrase technology. Any transcriptional errors that result from this process are unintentional.

## 2015-11-15 NOTE — Patient Instructions (Signed)
1.  Continue Provera 10 mg a day. 2.  Recommend mammography screening. 3.  Return in 1 year for annual gynecologic evaluation or as needed if any symptoms develop.

## 2015-11-15 NOTE — Addendum Note (Signed)
Addended by: Marchelle FolksMILLER, Rebie Peale G on: 11/15/2015 11:17 AM   Modules accepted: Orders

## 2016-01-28 ENCOUNTER — Encounter: Payer: Self-pay | Admitting: *Deleted

## 2016-01-28 ENCOUNTER — Emergency Department
Admission: EM | Admit: 2016-01-28 | Discharge: 2016-01-28 | Disposition: A | Discharge status: Home / Self Care | Payer: Medicare Other | Attending: Emergency Medicine | Admitting: Emergency Medicine

## 2016-01-28 ENCOUNTER — Emergency Department: Payer: Medicare Other

## 2016-01-28 DIAGNOSIS — Z792 Long term (current) use of antibiotics: Secondary | ICD-10-CM | POA: Diagnosis not present

## 2016-01-28 DIAGNOSIS — X58XXXA Exposure to other specified factors, initial encounter: Secondary | ICD-10-CM | POA: Diagnosis not present

## 2016-01-28 DIAGNOSIS — R05 Cough: Secondary | ICD-10-CM | POA: Diagnosis not present

## 2016-01-28 DIAGNOSIS — Y9289 Other specified places as the place of occurrence of the external cause: Secondary | ICD-10-CM | POA: Insufficient documentation

## 2016-01-28 DIAGNOSIS — T18128A Food in esophagus causing other injury, initial encounter: Secondary | ICD-10-CM | POA: Insufficient documentation

## 2016-01-28 DIAGNOSIS — R059 Cough, unspecified: Secondary | ICD-10-CM

## 2016-01-28 DIAGNOSIS — Y998 Other external cause status: Secondary | ICD-10-CM | POA: Diagnosis not present

## 2016-01-28 DIAGNOSIS — Y9389 Activity, other specified: Secondary | ICD-10-CM | POA: Diagnosis not present

## 2016-01-28 DIAGNOSIS — K117 Disturbances of salivary secretion: Secondary | ICD-10-CM

## 2016-01-28 DIAGNOSIS — Z79899 Other long term (current) drug therapy: Secondary | ICD-10-CM | POA: Diagnosis not present

## 2016-01-28 LAB — CBC
HEMATOCRIT: 38.7 % (ref 35.0–47.0)
HEMOGLOBIN: 13.3 g/dL (ref 12.0–16.0)
MCH: 34.3 pg — AB (ref 26.0–34.0)
MCHC: 34.5 g/dL (ref 32.0–36.0)
MCV: 99.4 fL (ref 80.0–100.0)
Platelets: 229 10*3/uL (ref 150–440)
RBC: 3.89 MIL/uL (ref 3.80–5.20)
RDW: 13.6 % (ref 11.5–14.5)
WBC: 9 10*3/uL (ref 3.6–11.0)

## 2016-01-28 LAB — BASIC METABOLIC PANEL
Anion gap: 6 (ref 5–15)
BUN: 15 mg/dL (ref 6–20)
CALCIUM: 8.7 mg/dL — AB (ref 8.9–10.3)
CHLORIDE: 104 mmol/L (ref 101–111)
CO2: 27 mmol/L (ref 22–32)
CREATININE: 0.93 mg/dL (ref 0.44–1.00)
GFR calc Af Amer: >60 mL/min (ref >60)
GFR calc non Af Amer: >60 mL/min (ref >60)
GLUCOSE: 120 mg/dL — AB (ref 65–99)
Potassium: 4.3 mmol/L (ref 3.5–5.1)
Sodium: 137 mmol/L (ref 135–145)

## 2016-01-28 LAB — POCT RAPID STREP A: Streptococcus, Group A Screen (Direct): NEGATIVE

## 2016-01-28 NOTE — ED Provider Notes (Signed)
[facility] Emergency Department Provider Note  ____________________________________________  Time seen: Approximately 11:30 AM  I have reviewed the triage vital signs and the nursing notes.   HISTORY  Chief Complaint Drooling    HPI Hannah Gardner is a 50 y.o. female with a history of Down syndrome, unrepaired congenital cleft palate, and family history of esophageal sphincter presenting for coughing and drooling. Patient is here with her mother, sister, and caregiver from the group home. At lunch yesterday, the patient was noted to have a brief coughing episode while she was eating pured foods, which is her baseline diet. In the afternoon, she had multiple episodes of nonproductive coughing and "seem like she needed to clear her throat." This morning she also spit up her morning medications. At baseline, she is able to take both thin and thick liquids but was coughing with any drinking this morning and therefore was made nothing by mouth. She has not had any congestion, rhinorrhea, fever, nausea or vomiting, or apparent abdominal pain. Last bowel movement was today and it was normal. Patient was seen by ENT and referred here. ENT examination showed clear ears bilaterally and a clear posterior oropharynx.   Past Medical History  Diagnosis Date  . Irregular periods/menstrual cycles   . Down's syndrome   . Cleft lip   . Hypsarrhythmia (HCC)   . Complication of anesthesia     pt was given 2 shots for sedation for a past surgery that made pt aggressive.  Mom went back with pt for last surgery, pt did well.  . Down syndrome   . Anxiety   . Depression   . Abnormal uterine bleeding (AUB)     Patient Active Problem List   Diagnosis Date Noted  . Abnormal uterine bleeding 11/15/2015  . Difficulty in walking 11/23/2014  . Seizure (HCC) 11/23/2014  . Disordered sleep 11/23/2014  . 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 AD (Alzheimer's disease) 04/05/2014  .  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 Ear examination under anesthesia Bilateral     remove cerumen from ears  . Cerumen removal Bilateral 04/18/2015    Procedure: CERUMEN REMOVAL, Bilateral ;  Surgeon: Geanie Logan, MD;  Location: [facility] ORS;  Service: ENT;  Laterality: Bilateral;  . Tooth extraction      Current Outpatient Rx  Name  Route  Sig  Dispense  Refill  . acetaminophen (TYLENOL) 325 MG tablet   Oral   Take 650 mg by mouth every 4 (four) hours as needed for mild pain or fever.          Marland Kitchen aluminum-magnesium hydroxide-simethicone (MAALOX) 200-200-20 MG/5ML SUSP   Oral   Take 30 mLs by mouth every 4 (four) hours as needed (upset stomach).         Marland Kitchen ammonium lactate (AMLACTIN) 12 % cream   Topical   Apply topically.         . calamine lotion   Topical   Apply 1 application topically as needed for itching.         . Calcium Carb-Cholecalciferol (CALCIUM 600 + D PO)   Oral   Take 1 tablet by mouth daily.         . Calcium Carbonate-Vitamin D (CALCIUM-VITAMIN D) 500-200 MG-UNIT per tablet   Oral   Take 1 tablet by mouth 2 (two) times daily.         . diphenhydrAMINE (BENADRYL) 25 mg  capsule   Oral   Take 12.5 mg by mouth every 4 (four) hours as needed for allergies.          Marland Kitchen donepezil (ARICEPT) 5 MG tablet   Oral   Take 5 mg by mouth 2 (two) times daily.          Marland Kitchen econazole nitrate 1 % cream   Topical   Apply 1 application topically 2 (two) times daily. To feet/ toes for one month         . escitalopram (LEXAPRO) 10 MG tablet   Oral   Take 10 mg by mouth 2 (two) times daily.         . hydrocerin (EUCERIN) CREA   Topical   Apply 1 application topically at bedtime.         . hydrocortisone 2.5 % cream   Topical   Apply 1 application topically as needed.         . hydrogen peroxide 1.5 % SOLN   Topical   Apply 4 drops topically as needed (to clean  ears).         . Lacosamide 150 MG TABS   Oral   Take 150 mg by mouth.         . levETIRAcetam (KEPPRA) 500 MG tablet   Oral   Take 1 tablet (500 mg total) by mouth 2 (two) times daily.   60 tablet   0   . loperamide (IMODIUM) 2 MG capsule   Oral   Take 4 mg by mouth as needed for diarrhea or loose stools.         Marland Kitchen LORazepam (ATIVAN) 0.5 MG tablet   Oral   Take 0.5 mg by mouth every 6 (six) hours as needed for anxiety (aggitation, transportation).          . LORazepam (ATIVAN) 1 MG tablet   Oral   Take 1 mg by mouth as needed (testing, MD visits, nail care).          . magnesium hydroxide (MILK OF MAGNESIA) 400 MG/5ML suspension   Oral   Take 30 mLs by mouth every three (3) days as needed for mild constipation or moderate constipation.          . medroxyPROGESTERone (PROVERA) 10 MG tablet   Oral   Take 1 tablet (10 mg total) by mouth daily.   30 tablet   11   . memantine (NAMENDA XR) 28 MG CP24 24 hr capsule   Oral   Take 28 mg by mouth at bedtime.         . Memantine HCl-Donepezil HCl 28-10 MG CP24   Oral   Take 1 tablet by mouth daily.         . Multiple Vitamin (MULTIVITAMIN WITH MINERALS) TABS tablet   Oral   Take 1 tablet by mouth daily.         Marland Kitchen neomycin-polymyxin-pramoxine (NEOSPORIN PLUS) 1 % cream   Topical   Apply 1 application topically as needed (for skin tears).         . NON FORMULARY   Topical   Apply 1 application topically daily as needed (dry skin). DML forte cream         . polymixin-bacitracin (POLYSPORIN) 500-10000 UNIT/GM OINT ointment   Topical   Apply topically.         . predniSONE (DELTASONE) 10 MG tablet      Take 3 tablets once a day for 3 days  9 tablet   0   . PSEUDOEPH-CHLORPHEN-DM PO   Oral   Take 10 mLs by mouth every 6 (six) hours as needed (for cold symptoms).         . senna (SENOKOT) 8.6 MG tablet   Oral   Take 2 tablets by mouth as needed for constipation.         .  sulfamethoxazole-trimethoprim (BACTRIM DS,SEPTRA DS) 800-160 MG per tablet   Oral   Take 1 tablet by mouth 2 (two) times daily.   14 tablet   0   . Talc (BABY POWDER EX)   Apply externally   Apply 1 application topically daily as needed (for moisture protection).         . traZODone (DESYREL) 50 MG tablet   Oral   Take 50 mg by mouth at bedtime as needed for sleep. May have a 2nd dose if wakes up between 0000 and 0000         . triamcinolone ointment (KENALOG) 0.1 %   Topical   Apply topically.           Allergies Lamotrigine  Family History  Problem Relation Age of Onset  . Cancer Neg Hx   . Diabetes Neg Hx   . Heart disease Neg Hx     Social History Social History  Substance Use Topics  . Smoking status: Never Smoker   . Smokeless tobacco: Never Used  . Alcohol Use: No    Review of Systems Limited due to patient inability to give review of systems. Acquired from the caregiver. Constitutional: No fever/chills. No syncope. Eyes: No eye discharge. ENT: Positive drooling yesterday which has resolved at this time. Cardiovascular: No obvious chest pain. Respiratory: No obvious shortness of breath.  Positive cough. Gastrointestinal: No abdominal pain.  No nausea, no vomiting.  No diarrhea.  No constipation. Genitourinary: No foul-smelling urine. Musculoskeletal: No obvious pain in the arms and legs. Skin: Negative for rash. Neurological: No change from baseline.  10-point ROS otherwise negative.  ____________________________________________   PHYSICAL EXAM:  VITAL SIGNS: ED Triage Vitals  Enc Vitals Group     BP 01/28/16 1039 115/96 mmHg     Pulse Rate 01/28/16 1039 134     Resp 01/28/16 1039 18     Temp 01/28/16 1039 97.7 F (36.5 C)     Temp Source 01/28/16 1039 Axillary     SpO2 01/28/16 1039 96 %     Weight 01/28/16 1039 115 lb (52.164 kg)     Height --      Head Cir --      Peak Flow --      Pain Score --      Pain Loc --      Pain Edu?  --      Excl. in GC? --     Constitutional: Patient is chronically ill-appearing and has multiple physical exam findings consistent with Down syndrome. She is nontoxic and resting comfortably. She is able to comply with some of the examination but is nonverbal and does not give any verbal answers.  Eyes: Conjunctivae are normal.  EOMI. no scleral icterus. No eye discharge. Head: Atraumatic. Nose: No congestion/rhinnorhea. Mouth/Throat: Mucous membranes are moist. Posterior pharynx is without any erythema or swelling. No tonsillar swelling or exudate. Uvula is midline. Patient is managing secretions well unable to swallow. No evidence of drooling or trismus. The examination was obtained with multiple people holding the patient down but I did have an excellent view  of the posterior pharynx. No obvious dental abnormalities including abscess. Neck: No stridor.  Supple. No JVD. No meningismus.  Cardiovascular: Normal rate, regular rhythm. No murmurs, rubs or gallops.  Respiratory: Normal respiratory effort.  No retractions. Lungs CTAB.  No wheezes, rales or ronchi. Gastrointestinal: Obese. Soft and nontender. No distention. No peritoneal signs. Musculoskeletal: Moves all extremities well without obvious pain. Neurologic:  Patient is nonverbal. Face is symmetric. Positive disconjugate gaze which is baseline. Moves all extremities well. Skin:  Skin is warm, dry and intact. No rash noted. Psychiatric: Mood and affect are normal.  ____________________________________________   LABS (all labs ordered are listed, but only abnormal results are displayed)  Labs Reviewed  CBC - Abnormal; Notable for the following:    MCH 34.3 (*)    All other components within normal limits  BASIC METABOLIC PANEL - Abnormal; Notable for the following:    Glucose, Bld 120 (*)    Calcium 8.7 (*)    All other components within normal limits  POCT RAPID STREP A    ____________________________________________  EKG  Not indicated  ____________________________________________  RADIOLOGY  Dg Neck Soft Tissue  01/28/2016  CLINICAL DATA:  50 year old female with history of Down's syndrome and dementia. Excessive drooling and dysphasia. EXAM: NECK SOFT TISSUES - 1+ VIEW COMPARISON:  No priors. FINDINGS: Two view examination is exceedingly limited due to partial obscuration of the lower cervical spine from C5-C7 on the lateral projection. With these limitations in mind, there is no acute displaced fracture of the cervical spine as visualized. Prevertebral soft tissues are normal. Alignment appears anatomic. IMPRESSION: 1. No acute abnormality of the cervical spine. Electronically Signed   By: Trudie Reed M.D.   On: 01/28/2016 13:14   Dg Chest 2 View  01/28/2016  CLINICAL DATA:  Difficulty swallowing EXAM: CHEST  2 VIEW COMPARISON:  Port 10/2006 FINDINGS: The heart size and mediastinal contours are within normal limits. Both lungs are clear. The visualized skeletal structures are unremarkable. Apical pleural scarring bilaterally IMPRESSION: No active cardiopulmonary disease. Electronically Signed   By: Marlan Palau M.D.   On: 01/28/2016 13:06    ____________________________________________   PROCEDURES  Procedure(s) performed: None  Critical Care performed: No ____________________________________________   INITIAL IMPRESSION / ASSESSMENT AND PLAN / ED COURSE  Pertinent labs & imaging results that were available during my care of the patient were reviewed by me and considered in my medical decision making (see chart for details).  50 y.o. female with a history of Down syndrome, congenital cleft palate which has not been repaired, who was seen to have a brief episode of coughing with her usual pure diet yesterday, followed by 12 hours of "throat clearing" and some mild drooling. I did a bedside swallow examination, and the patient was able to  drink apple juice without any coughing, spitting, or apparent discomfort. Her caregivers all state that this is markedly different then yesterday and earlier today. The patient is managing her secretions well and does not have any evidence of respiratory compromise. She has not been having any infectious symptoms and does not have any infectious findings on my examination. At this time, the most likely etiology is that the patient may have had a mild food impaction, which has passed. I will get a chest x-ray in neck x-ray, as well as basic labs, but if she continues to drink well and remains at baseline, and her examination here is negative, I'll plan to discharge her home with PMD follow-up.  -----------------------------------------  1:37 PM on 01/28/2016 -----------------------------------------  The patient has a normal heart rate at this time. She is resting comfortably and has tolerated an entire can of apple juice without any coughing or regurgitation. She is not having any evidence of stridor, shortness of breath, or drooling. Her labs are reassuring and her strep test is negative. She has a neck x-ray which does not show any soft tissue abnormalities, and her chest x-ray does not show any acute abnormalities. We will give her a by mouth challenge with applesauce or another thickened pured food, and if she is able to tolerate this she'll be discharged home with close PMD follow-up. I discussed all of these findings with the patient's mother and her group home caregiver, who are in agreement with the plan and feel that the patient is back to her baseline.  ____________________________________________  FINAL CLINICAL IMPRESSION(S) / ED DIAGNOSES  Final diagnoses:  Food impaction of esophagus, initial encounter  Cough  Drooling      NEW MEDICATIONS STARTED DURING THIS VISIT:  New Prescriptions   No medications on file      Rockne MenghiniAnne-Caroline Rajvi Armentor, MD 01/28/16 1338

## 2016-01-28 NOTE — Discharge Instructions (Signed)
Please continue Hannah Gardner's usual diet of liquids and pured foods. Give her small portions at a time and make sure that she is monitored by caregiver while eating.  Please have Hannah Gardner follow-up with her primary care physician tomorrow for reevaluation.  Return to the emergency department for any evidence of coughing, drooling, choking, fever, pain, shortness of breath, or any other symptoms concerning to you.

## 2016-01-28 NOTE — ED Notes (Addendum)
States they were sent form Dr. Talmage NapBennett's office for evaluation, pt not swallowing saliva, states last time she ate or drank was yesterday at breakfast, pt hx of down syndrome, drool on shirt and face upon assessment, pt gagging in triage and coughing

## 2016-01-28 NOTE — ED Notes (Signed)
Pt from group home with excessive drooling, dysphagia. Caregivers report no intake since yesterday morning except for a sip of water with medication this morning, resulting in some coughing. Pt has downs syndrome, dementia, and seizure disorder.

## 2016-01-28 NOTE — ED Notes (Signed)
Pt does not like to be touched. Caregivers give ativan when taking her to appointments so she will allow it. Pt given 1mg  ativan po today.

## 2016-04-23 ENCOUNTER — Emergency Department
Admission: EM | Admit: 2016-04-23 | Discharge: 2016-04-23 | Disposition: A | Discharge status: Home / Self Care | Payer: Medicare Other | Attending: Emergency Medicine | Admitting: Emergency Medicine

## 2016-04-23 ENCOUNTER — Encounter: Payer: Self-pay | Admitting: *Deleted

## 2016-04-23 DIAGNOSIS — G309 Alzheimer's disease, unspecified: Secondary | ICD-10-CM | POA: Insufficient documentation

## 2016-04-23 DIAGNOSIS — Z8679 Personal history of other diseases of the circulatory system: Secondary | ICD-10-CM | POA: Insufficient documentation

## 2016-04-23 DIAGNOSIS — G40909 Epilepsy, unspecified, not intractable, without status epilepticus: Secondary | ICD-10-CM | POA: Diagnosis present

## 2016-04-23 DIAGNOSIS — F329 Major depressive disorder, single episode, unspecified: Secondary | ICD-10-CM | POA: Insufficient documentation

## 2016-04-23 DIAGNOSIS — Z79899 Other long term (current) drug therapy: Secondary | ICD-10-CM | POA: Diagnosis not present

## 2016-04-23 MED ORDER — LEVETIRACETAM 1000 MG PO TABS: 1000.0000 mg | ORAL_TABLET | Freq: Two times a day (BID) | ORAL | Status: DC

## 2016-04-23 MED ORDER — LEVETIRACETAM 250 MG PO TABS: 250.0000 mg | ORAL_TABLET | Freq: Two times a day (BID) | ORAL | Status: DC

## 2016-04-23 NOTE — ED Notes (Signed)
Pt has a history of seizures, pt was at Dr.Potter's office today and had a seizure, pt sent by MD, pt is alert at baseline, pt has down syndrome and dementia

## 2016-04-23 NOTE — ED Provider Notes (Signed)
[facility] Emergency Department Provider Note   ____________________________________________  Time seen:  I have reviewed the triage vital signs and the triage nursing note.  HISTORY  Chief Complaint Seizures   Historian Patient's caregiver  HPI Hannah Gardner is a 50 y.o. female who lives at a group home due to Down syndrome and seizures, here in ED after seizure in waiting room at neurologist, Dr. Daisy Blossom waiting room today, just prior to arrival.  She currently takes 1000 mg Keppra twice daily and Vimpat 150 mg twice daily.  She had one seizure in March, and then 2 seizures in June so far. Seizures usually last less than 2 minutes. She has not had to be brought to the emergency department for the last couple of seizures. She's not been recently ill. No recent fevers. She has a chronic cough, but no worse or different or trouble breathing. No vomiting or diarrhea.  She was staring off and then had heavy breathing which is typical with her prior seizures. At this point time she is back to her baseline which is awake, nonverbal, and a little agitated.    Past Medical History  Diagnosis Date  . Irregular periods/menstrual cycles   . Down's syndrome   . Cleft lip   . Hypsarrhythmia (HCC)   . Complication of anesthesia     pt was given 2 shots for sedation for a past surgery that made pt aggressive.  Mom went back with pt for last surgery, pt did well.  . Down syndrome   . Anxiety   . Depression   . Abnormal uterine bleeding (AUB)     Patient Active Problem List   Diagnosis Date Noted  . Abnormal uterine bleeding 11/15/2015  . Difficulty in walking 11/23/2014  . Seizure (HCC) 11/23/2014  . Disordered sleep 11/23/2014  . 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 AD (Alzheimer's disease) 04/05/2014  . 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 Ear examination under anesthesia  Bilateral     remove cerumen from ears  . Cerumen removal Bilateral 04/18/2015    Procedure: CERUMEN REMOVAL, Bilateral ;  Surgeon: Geanie Logan, MD;  Location: [facility] ORS;  Service: ENT;  Laterality: Bilateral;  . Tooth extraction      Current Outpatient Rx  Name  Route  Sig  Dispense  Refill  . acetaminophen (TYLENOL) 325 MG tablet   Oral   Take 650 mg by mouth every 4 (four) hours as needed for mild pain or fever.          Marland Kitchen aluminum-magnesium hydroxide-simethicone (MAALOX) 200-200-20 MG/5ML SUSP   Oral   Take 30 mLs by mouth every 4 (four) hours as needed (upset stomach).         Marland Kitchen ammonium lactate (AMLACTIN) 12 % cream   Topical   Apply topically.         . calamine lotion   Topical   Apply 1 application topically as needed for itching.         . Calcium Carb-Cholecalciferol (CALCIUM 600 + D PO)   Oral   Take 1 tablet by mouth daily.         . Calcium Carbonate-Vitamin D (CALCIUM-VITAMIN D) 500-200 MG-UNIT per tablet   Oral   Take 1 tablet by mouth 2 (two) times daily.         . diphenhydrAMINE (BENADRYL) 25 mg capsule   Oral   Take 12.5 mg  by mouth every 4 (four) hours as needed for allergies.          Marland Kitchen donepezil (ARICEPT) 5 MG tablet   Oral   Take 5 mg by mouth 2 (two) times daily.          Marland Kitchen econazole nitrate 1 % cream   Topical   Apply 1 application topically 2 (two) times daily. To feet/ toes for one month         . escitalopram (LEXAPRO) 10 MG tablet   Oral   Take 10 mg by mouth 2 (two) times daily.         . hydrocerin (EUCERIN) CREA   Topical   Apply 1 application topically at bedtime.         . hydrocortisone 2.5 % cream   Topical   Apply 1 application topically as needed.         . hydrogen peroxide 1.5 % SOLN   Topical   Apply 4 drops topically as needed (to clean ears).         . Lacosamide 150 MG TABS   Oral   Take 150 mg by mouth.         . levETIRAcetam (KEPPRA) 1000 MG tablet   Oral   Take 1 tablet (1,000  mg total) by mouth 2 (two) times daily.   60 tablet   3   . levETIRAcetam (KEPPRA) 250 MG tablet   Oral   Take 1 tablet (250 mg total) by mouth 2 (two) times daily.   60 tablet   3   . loperamide (IMODIUM) 2 MG capsule   Oral   Take 4 mg by mouth as needed for diarrhea or loose stools.         Marland Kitchen LORazepam (ATIVAN) 0.5 MG tablet   Oral   Take 0.5 mg by mouth every 6 (six) hours as needed for anxiety (aggitation, transportation).          . LORazepam (ATIVAN) 1 MG tablet   Oral   Take 1 mg by mouth as needed (testing, MD visits, nail care).          . magnesium hydroxide (MILK OF MAGNESIA) 400 MG/5ML suspension   Oral   Take 30 mLs by mouth every three (3) days as needed for mild constipation or moderate constipation.          . medroxyPROGESTERone (PROVERA) 10 MG tablet   Oral   Take 1 tablet (10 mg total) by mouth daily.   30 tablet   11   . memantine (NAMENDA XR) 28 MG CP24 24 hr capsule   Oral   Take 28 mg by mouth at bedtime.         . Memantine HCl-Donepezil HCl 28-10 MG CP24   Oral   Take 1 tablet by mouth daily.         . Multiple Vitamin (MULTIVITAMIN WITH MINERALS) TABS tablet   Oral   Take 1 tablet by mouth daily.         Marland Kitchen neomycin-polymyxin-pramoxine (NEOSPORIN PLUS) 1 % cream   Topical   Apply 1 application topically as needed (for skin tears).         . NON FORMULARY   Topical   Apply 1 application topically daily as needed (dry skin). DML forte cream         . polymixin-bacitracin (POLYSPORIN) 500-10000 UNIT/GM OINT ointment   Topical   Apply topically.         Marland Kitchen  predniSONE (DELTASONE) 10 MG tablet      Take 3 tablets once a day for 3 days   9 tablet   0   . PSEUDOEPH-CHLORPHEN-DM PO   Oral   Take 10 mLs by mouth every 6 (six) hours as needed (for cold symptoms).         . senna (SENOKOT) 8.6 MG tablet   Oral   Take 2 tablets by mouth as needed for constipation.         . sulfamethoxazole-trimethoprim (BACTRIM  DS,SEPTRA DS) 800-160 MG per tablet   Oral   Take 1 tablet by mouth 2 (two) times daily.   14 tablet   0   . Talc (BABY POWDER EX)   Apply externally   Apply 1 application topically daily as needed (for moisture protection).         . traZODone (DESYREL) 50 MG tablet   Oral   Take 50 mg by mouth at bedtime as needed for sleep. May have a 2nd dose if wakes up between 0000 and 0000         . triamcinolone ointment (KENALOG) 0.1 %   Topical   Apply topically.           Allergies Lamotrigine  Family History  Problem Relation Age of Onset  . Cancer Neg Hx   . Diabetes Neg Hx   . Heart disease Neg Hx     Social History Social History  Substance Use Topics  . Smoking status: Never Smoker   . Smokeless tobacco: Never Used  . Alcohol Use: No    Review of Systems  Constitutional: Negative for fever. Eyes: Chronic crossed eyes. ENT: Negative for nasal congestion Cardiovascular: Negative for chest pain. Respiratory: Negative for shortness of breath. Gastrointestinal: Negative for abdominal pain, vomiting and diarrhea. Genitourinary:  Musculoskeletal:  Skin: Negative for rash. Neurological: Negative for altered mental status. 10 point Review of Systems otherwise negative ____________________________________________   PHYSICAL EXAM:  VITAL SIGNS: ED Triage Vitals  Enc Vitals Group     BP 04/23/16 0938 111/50 mmHg     Pulse Rate 04/23/16 0938 58     Resp 04/23/16 0938 18     Temp --      Temp src --      SpO2 04/23/16 0938 97 %     Weight 04/23/16 0938 107 lb (48.535 kg)     Height 04/23/16 0938 4\' 10"  (1.473 m)     Head Cir --      Peak Flow --      Pain Score --      Pain Loc --      Pain Edu? --      Excl. in GC? --      Constitutional: Alert, tries to push me away. Nonverbal. Appears to interact with her caregiver. HEENT   Head: Normocephalic and atraumatic.      Eyes: Conjunctivae are normal. Crossed eyes      Ears:         Nose: No  congestion/rhinnorhea.   Mouth/Throat: Mucous membranes are moist.   Neck: No stridor. Cardiovascular/Chest: Normal rate, regular rhythm.  No murmurs, rubs, or gallops. Respiratory: Normal respiratory effort without tachypnea nor retractions. Breath sounds are clear and equal bilaterally. No wheezes/rales/rhonchi. Gastrointestinal: Soft. No distention, no guarding, no rebound. Nontender.    Genitourinary/rectal:Deferred Musculoskeletal: Nontender with normal range of motion in all extremities. No joint effusions.  No lower extremity tenderness.  No edema. Neurologic:  Nonfocal. Moving  all x-rays. Neurologic baseline per caregiver. No gross or focal neurologic deficits are appreciated. Skin:  Skin is warm, dry and intact. No rash noted.  ____________________________________________   EKG I, Governor Rooks, MD, the attending physician have personally viewed and interpreted all ECGs.  None ____________________________________________  LABS (pertinent positives/negatives)  Labs Reviewed - No data to display  ____________________________________________  RADIOLOGY All Xrays were viewed by me. Imaging interpreted by Radiologist.  None __________________________________________  PROCEDURES  Procedure(s) performed: None  Critical Care performed: None  ____________________________________________   ED COURSE / ASSESSMENT AND PLAN  Pertinent labs & imaging results that were available during my care of the patient were reviewed by me and considered in my medical decision making (see chart for details).    It sounds like this patient with history of epilepsy had a seizure similar to her prior and is now back to baseline. No recent illnesses. I discussed with the caregiver I don't recommend any additional invasive evaluation today. I spoke with Dr. Malvin Johns, the patient's neurologist who did recommend an increase in the Keppra to 1250 mg twice daily and keep the Vimpat at 150 mg  twice daily.  Follow-up in 2-3 months per Dr. Malvin Johns.  I wrote a new prescription of Keppra for 1000 mg and a 250 mg tablet for total of 1250 mg twice daily.  CONSULTATIONS:   Dr. Malvin Johns, neurology by phone.   Patient / Family / Caregiver informed of clinical course, medical decision-making process, and agree with plan.   I discussed return precautions, follow-up instructions, and discharged instructions with patient and/or family.   ___________________________________________   FINAL CLINICAL IMPRESSION(S) / ED DIAGNOSES   Final diagnoses:  Seizure disorder [facility])              Note: This dictation was prepared with Dragon dictation. Any transcriptional errors that result from this process are unintentional   Governor Rooks, MD 04/23/16 1126

## 2016-04-23 NOTE — ED Notes (Signed)
Pt's caregiver verbalized understanding of discharge instructions. NAD at this time. 

## 2016-04-23 NOTE — Discharge Instructions (Signed)
Dr. Malvin JohnsPotter recommends increasing the dose of Keppra to 1250 mg twice daily and continue Vimpat at the same dose of 150 mg twice daily.  Next appointment with neurologist, Dr. Malvin JohnsPotter 2-3 months. Next the next and return to emergency department for any worsening condition including fever, trouble breathing, vomiting or diarrhea, altered mental status from her baseline, seizure lasting longer than 5 or 10 minutes, or seizures its different from her typical seizures.   Epilepsy People with epilepsy have times when they shake and jerk uncontrollably (seizures). This happens when there is a sudden change in brain function. Epilepsy may have many possible causes. Anything that disturbs the normal pattern of brain cell activity can lead to seizures. HOME CARE   Follow your doctor's instructions about driving and safety during normal activities.  Get enough sleep.  Only take medicine as told by your doctor.  Avoid things that you know can cause you to have seizures (triggers).  Write down when your seizures happen and what you remember about each seizure. Write down anything you think may have caused the seizure to happen.  Tell the people you live and work with that you have seizures. Make sure they know how to help you. They should:  Cushion your head and body.  Turn you on your side.  Not restrain you.  Not place anything inside your mouth.  Call for local emergency medical help if there is any question about what has happened.  Keep all follow-up visits with your doctor. This is very important. GET HELP IF:  You get an infection or start to feel sick. You may have more seizures when you are sick.  You are having seizures more often.  Your seizure pattern is changing. GET HELP RIGHT AWAY IF:   A seizure does not stop after a few seconds or minutes.  A seizure causes you to have trouble breathing.  A seizure gives you a very bad headache.  A seizure makes you unable to speak or  use a part of your body.   This information is not intended to replace advice given to you by your health care provider. Make sure you discuss any questions you have with your health care provider.   Document Released: 08/24/2009 Document Revised: 08/17/2013 Document Reviewed: 06/08/2013 Elsevier Interactive Patient Education [employer]2016 Elsevier Inc.

## 2016-05-09 ENCOUNTER — Emergency Department
Admission: EM | Admit: 2016-05-09 | Discharge: 2016-05-09 | Disposition: A | Discharge status: Home / Self Care | Payer: Medicare Other | Attending: Emergency Medicine | Admitting: Emergency Medicine

## 2016-05-09 ENCOUNTER — Encounter: Payer: Self-pay | Admitting: Emergency Medicine

## 2016-05-09 DIAGNOSIS — Z7952 Long term (current) use of systemic steroids: Secondary | ICD-10-CM | POA: Insufficient documentation

## 2016-05-09 DIAGNOSIS — F329 Major depressive disorder, single episode, unspecified: Secondary | ICD-10-CM | POA: Insufficient documentation

## 2016-05-09 DIAGNOSIS — G40909 Epilepsy, unspecified, not intractable, without status epilepticus: Secondary | ICD-10-CM | POA: Insufficient documentation

## 2016-05-09 DIAGNOSIS — G309 Alzheimer's disease, unspecified: Secondary | ICD-10-CM | POA: Diagnosis not present

## 2016-05-09 DIAGNOSIS — Z79899 Other long term (current) drug therapy: Secondary | ICD-10-CM | POA: Diagnosis not present

## 2016-05-09 DIAGNOSIS — Z792 Long term (current) use of antibiotics: Secondary | ICD-10-CM | POA: Diagnosis not present

## 2016-05-09 DIAGNOSIS — R569 Unspecified convulsions: Secondary | ICD-10-CM | POA: Diagnosis present

## 2016-05-09 LAB — GLUCOSE, CAPILLARY: GLUCOSE-CAPILLARY: 120 mg/dL — AB (ref 65–99)

## 2016-05-09 MED ORDER — LEVETIRACETAM 500 MG PO TABS: 1500.0000 mg | ORAL_TABLET | Freq: Two times a day (BID) | ORAL | Status: AC

## 2016-05-09 NOTE — ED Provider Notes (Signed)
[facility] Emergency Department Provider Note        Time seen: ----------------------------------------- 2:51 PM on 05/09/2016 -----------------------------------------  L5 caveat: Review of systems and history is limited by mental retardation  I have reviewed the triage vital signs and the nursing notes.   HISTORY  Chief Complaint Seizures    HPI Hannah Gardner is a 50 y.o. female who presents to the ER for seizure-like activity happened today. Patient is redness to areas of her face that the caregiver states was caused by seizure. Patient presents with severe baseline intellectual abilities and nonverbal state. She takes seizure medicines daily including Keppra and Vimpat. She is ambulatory and somewhat cooperative on arrival. She is unable to give review of systems or report.   Past Medical History  Diagnosis Date  . Irregular periods/menstrual cycles   . Down's syndrome   . Cleft lip   . Hypsarrhythmia (HCC)   . Complication of anesthesia     pt was given 2 shots for sedation for a past surgery that made pt aggressive.  Mom went back with pt for last surgery, pt did well.  . Down syndrome   . Anxiety   . Depression   . Abnormal uterine bleeding (AUB)     Patient Active Problem List   Diagnosis Date Noted  . Abnormal uterine bleeding 11/15/2015  . Difficulty in walking 11/23/2014  . Seizure (HCC) 11/23/2014  . Disordered sleep 11/23/2014  . 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 AD (Alzheimer's disease) 04/05/2014  . 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 Ear examination under anesthesia Bilateral     remove cerumen from ears  . Cerumen removal Bilateral 04/18/2015    Procedure: CERUMEN REMOVAL, Bilateral ;  Surgeon: Geanie LoganPaul Bennett, MD;  Location: [facility] ORS;  Service: ENT;  Laterality: Bilateral;  . Tooth extraction      Allergies Lamotrigine  Social  History Social History  Substance Use Topics  . Smoking status: Never Smoker   . Smokeless tobacco: Never Used  . Alcohol Use: No    Review of Systems Unknown at this time ____________________________________________   PHYSICAL EXAM:  VITAL SIGNS: ED Triage Vitals  Enc Vitals Group     BP 05/09/16 1248 97/59 mmHg     Pulse Rate 05/09/16 1248 74     Resp --      Temp 05/09/16 1248 98.2 F (36.8 C)     Temp Source 05/09/16 1248 Oral     SpO2 05/09/16 1248 99 %     Weight 05/09/16 1248 107 lb (48.535 kg)     Height 05/09/16 1248 4\' 6"  (1.372 m)     Head Cir --      Peak Flow --      Pain Score --      Pain Loc --      Pain Edu? --      Excl. in GC? --     Constitutional: Alert, No distress is noted Eyes: Conjunctivae are normal. PERRL. Normal extraocular movements. ENT   Head: Abrasions are appreciated particular on the right side of the face   Nose: No congestion/rhinnorhea.   Mouth/Throat: Mucous membranes are moist.   Neck: No stridor. Cardiovascular: Normal rate, regular rhythm. No murmurs, rubs, or gallops. Respiratory: Normal respiratory effort without tachypnea nor retractions. Breath sounds are clear and equal bilaterally. No wheezes/rales/rhonchi. Musculoskeletal: Nontender with normal range of motion in all extremities.  Neurologic:  No gross focal neurologic deficits are appreciated Skin:  Skin is warm, dry and intact. Abrasions are appreciated to the right side of face ____________________________________________  ED COURSE:  Pertinent labs & imaging results that were available during my care of the patient were reviewed by me and considered in my medical decision making (see chart for details). Patient presents to the ER after seizure event. I will discuss with neurology and likely increase her Keppra to the maximum dose. ____________________________________________    LABS (pertinent positives/negatives)  Labs Reviewed  GLUCOSE,  CAPILLARY - Abnormal; Notable for the following:    Glucose-Capillary 120 (*)    All other components within normal limits  CBG MONITORING, ED   ____________________________________________  FINAL ASSESSMENT AND PLAN  Seizure  Plan: Patient with seizure-like event at her residence. She has been at her neurologic baseline for the last 6 hours. I do not see a need to do any further testing. I will increase her Keppra dose daily. Advised close outpatient follow-up with neurology.   Emily FilbertWilliams, Vasilis Luhman E, MD   Note: This dictation was prepared with Dragon dictation. Any transcriptional errors that result from this process are unintentional   Emily FilbertJonathan E Siriyah Ambrosius, MD 05/09/16 1536

## 2016-05-09 NOTE — ED Notes (Addendum)
Patient presents to the ED post seizure like activity.  Patient has redness to areas of her face that caregiver states were caused by the seizure.  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  Patient takes seizure medication regularly and caregiver states patient has been taking medication regularly.  Patient takes Keppra 1250mg  po bid and vimpat 150mg  po bid for seizures.  Patient is ambulatory to triage.  Patient is semi-cooperative at this time, allowed this RN to check blood pressure but it took multiple attempts for a temperature.

## 2016-05-09 NOTE — Discharge Instructions (Signed)

## 2016-05-26 ENCOUNTER — Emergency Department: Payer: Medicare Other

## 2016-05-26 ENCOUNTER — Emergency Department
Admission: EM | Admit: 2016-05-26 | Discharge: 2016-05-26 | Disposition: A | Discharge status: Home / Self Care | Payer: Medicare Other | Attending: Emergency Medicine | Admitting: Emergency Medicine

## 2016-05-26 ENCOUNTER — Encounter: Payer: Self-pay | Admitting: Emergency Medicine

## 2016-05-26 DIAGNOSIS — Z8669 Personal history of other diseases of the nervous system and sense organs: Secondary | ICD-10-CM | POA: Diagnosis not present

## 2016-05-26 DIAGNOSIS — Z79899 Other long term (current) drug therapy: Secondary | ICD-10-CM | POA: Diagnosis not present

## 2016-05-26 DIAGNOSIS — G309 Alzheimer's disease, unspecified: Secondary | ICD-10-CM | POA: Insufficient documentation

## 2016-05-26 DIAGNOSIS — R1310 Dysphagia, unspecified: Secondary | ICD-10-CM | POA: Insufficient documentation

## 2016-05-26 DIAGNOSIS — F329 Major depressive disorder, single episode, unspecified: Secondary | ICD-10-CM | POA: Insufficient documentation

## 2016-05-26 DIAGNOSIS — R0989 Other specified symptoms and signs involving the circulatory and respiratory systems: Secondary | ICD-10-CM | POA: Diagnosis present

## 2016-05-26 NOTE — Discharge Instructions (Signed)
Return to the emergency department for any trouble breathing, fever, chest pain, or any other symptoms concerning to you.  As we discussed, I'm most suspicious that her intermittent gagging and coughing may be cognitive related rather than structural. However, to continue the evaluation, discussed with Dr. Dareen PianoAnderson about the possibility of potential gastroenterology referral.   Dysphagia Swallowing problems (dysphagia) occur when solids and liquids seem to stick in your throat on the way down to your stomach, or the food takes longer to get to the stomach. Other symptoms include regurgitating food, noises coming from the throat, chest discomfort with swallowing, and a feeling of fullness or the feeling of something being stuck in your throat when swallowing. When blockage in your throat is complete, it may be associated with drooling. CAUSES  Problems with swallowing may occur because of problems with the muscles. The food cannot be propelled in the usual manner into your stomach. You may have ulcers, scar tissue, or inflammation in the tube down which food travels from your mouth to your stomach (esophagus), which blocks food from passing normally into the stomach. Causes of inflammation include:  Acid reflux from your stomach into your esophagus.  Infection.  Radiation treatment for cancer.  Medicines taken without enough fluids to wash them down into your stomach. You may have nerve problems that prevent signals from being sent to the muscles of your esophagus to contract and move your food down to your stomach. Globus pharyngeus is a relatively common problem in which there is a sense of an obstruction or difficulty in swallowing, without any physical abnormalities of the swallowing passages being found. This problem usually improves over time with reassurance and testing to rule out other causes. DIAGNOSIS Dysphagia can be diagnosed and its cause can be determined by tests in which you  swallow a white substance that helps illuminate the inside of your throat (contrast medium) while X-rays are taken. Sometimes a flexible telescope that is inserted down your throat (endoscopy) to look at your esophagus and stomach is used. TREATMENT   If the dysphagia is caused by acid reflux or infection, medicines may be used.  If the dysphagia is caused by problems with your swallowing muscles, swallowing therapy may be used to help you strengthen your swallowing muscles.  If the dysphagia is caused by a blockage or mass, procedures to remove the blockage may be done. HOME CARE INSTRUCTIONS  Try to eat soft food that is easier to swallow and check your weight on a daily basis to be sure that it is not decreasing.  Be sure to drink liquids when sitting upright (not lying down). SEEK MEDICAL CARE IF:  You are losing weight because you are unable to swallow.  You are coughing when you drink liquids (aspiration).  You are coughing up partially digested food. SEEK IMMEDIATE MEDICAL CARE IF:  You are unable to swallow your own saliva .  You are having shortness of breath or a fever, or both.  You have a hoarse voice along with difficulty swallowing. MAKE SURE YOU:  Understand these instructions.  Will watch your condition.  Will get help right away if you are not doing well or get worse.   This information is not intended to replace advice given to you by your health care provider. Make sure you discuss any questions you have with your health care provider.   Document Released: 10/24/2000 Document Revised: 11/17/2014 Document Reviewed: 04/15/2013 Elsevier Interactive Patient Education [employer] Inc[employer]

## 2016-05-26 NOTE — ED Provider Notes (Signed)
[facility] Emergency Department Provider Note   ____________________________________________  Time seen: I have reviewed the triage vital signs and the triage nursing note.  HISTORY  Chief Complaint Aspiration   Historian Patient's caregivers from group home  HPI Hannah Gardner is a 50 y.o. female with Down syndrome, here after coughing and gagging episode while eating breakfast this morning.  Apparently she has had some issues with coughing and gagging concerning for aspiration for several months. They have been potentially planning to do a swallow study, however because of her dementia and agitation they didn't think that she would be able to cooperate with the exam. She has been on applesauce and mashed potato thickness foods. She does not have coughing and gagging with all meals, only intermittently.  Today the dietitian who comes by once per month witness the patient coughing and gagging and recommended that she be sent to the emergency department for aspiration evaluation.  No symptoms since this morning. She occasionally gags just on secretions and coughs.    Past Medical History  Diagnosis Date  . Irregular periods/menstrual cycles   . Down's syndrome   . Cleft lip   . Hypsarrhythmia (HCC)   . Complication of anesthesia     pt was given 2 shots for sedation for a past surgery that made pt aggressive.  Mom went back with pt for last surgery, pt did well.  . Down syndrome   . Anxiety   . Depression   . Abnormal uterine bleeding (AUB)     Patient Active Problem List   Diagnosis Date Noted  . Abnormal uterine bleeding 11/15/2015  . Difficulty in walking 11/23/2014  . Seizure (HCC) 11/23/2014  . Disordered sleep 11/23/2014  . 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 AD (Alzheimer's disease) 04/05/2014  . 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 Ear examination under  anesthesia Bilateral     remove cerumen from ears  . Cerumen removal Bilateral 04/18/2015    Procedure: CERUMEN REMOVAL, Bilateral ;  Surgeon: Geanie LoganPaul Bennett, MD;  Location: [facility] ORS;  Service: ENT;  Laterality: Bilateral;  . Tooth extraction      Current Outpatient Rx  Name  Route  Sig  Dispense  Refill  . acetaminophen (TYLENOL) 325 MG tablet   Oral   Take 650 mg by mouth every 4 (four) hours as needed for mild pain or fever.          Marland Kitchen. aluminum-magnesium hydroxide-simethicone (MAALOX) 200-200-20 MG/5ML SUSP   Oral   Take 30 mLs by mouth every 4 (four) hours as needed (upset stomach).         Marland Kitchen. ammonium lactate (AMLACTIN) 12 % cream   Topical   Apply topically 2 (two) times daily as needed for dry skin.          . calamine lotion   Topical   Apply 1 application topically as needed for itching.         . Calcium Carbonate-Vitamin D (CALCIUM-VITAMIN D) 500-200 MG-UNIT per tablet   Oral   Take 1 tablet by mouth 2 (two) times daily.         . diphenhydrAMINE (BENADRYL) 25 MG tablet   Oral   Take 12.5-25 mg by mouth as needed for allergies. Every 4 to 6 hours PRN         . econazole nitrate 1 % cream   Topical   Apply 1 application  topically 2 (two) times daily. To feet/ toes for one month         . Emollient (DML FORTE) CREA   Apply externally   Apply topically daily. Apply to face and dry.rough areas of skin (hands/fingers)         . escitalopram (LEXAPRO) 10 MG tablet   Oral   Take 10 mg by mouth 2 (two) times daily.         . hydrocerin (EUCERIN) CREA   Topical   Apply 1 application topically at bedtime.         . hydrocortisone 2.5 % cream   Topical   Apply 1 application topically as needed. Apply to vulva or reddened areas for redness         . hydrogen peroxide 1.5 % SOLN   Both Ears   Place 4 drops into both ears See admin instructions. 2 times a month         . Lacosamide 150 MG TABS   Oral   Take 150 mg by mouth 2 (two) times  daily.          Marland Kitchen levETIRAcetam (KEPPRA) 500 MG tablet   Oral   Take 3 tablets (1,500 mg total) by mouth 2 (two) times daily.   180 tablet   2   . loperamide (IMODIUM) 2 MG capsule   Oral   Take 2-4 mg by mouth as needed for diarrhea or loose stools.          Marland Kitchen LORazepam (ATIVAN) 0.5 MG tablet   Oral   Take 0.5 mg by mouth as needed. For agitation; may repeat in 20 minutes with RN approval         . LORazepam (ATIVAN) 0.5 MG tablet   Oral   Take 0.5 mg by mouth every 6 (six) hours as needed. Prior to family transport or on home visits         . LORazepam (ATIVAN) 1 MG tablet   Oral   Take 1 mg by mouth as needed. 1.5-2 hours prior to invasive diagnostic testing/MD visit or 30-60 minutes prior to nail care         . magnesium hydroxide (MILK OF MAGNESIA) 400 MG/5ML suspension   Oral   Take 30 mLs by mouth every three (3) days as needed for mild constipation or moderate constipation.          . medroxyPROGESTERone (PROVERA) 10 MG tablet   Oral   Take 1 tablet (10 mg total) by mouth daily. Patient taking differently: Take 10 mg by mouth every morning.    30 tablet   11   . memantine (NAMENDA XR) 28 MG CP24 24 hr capsule   Oral   Take 28 mg by mouth at bedtime.         . Multiple Vitamin (MULTIVITAMIN WITH MINERALS) TABS tablet   Oral   Take 1 tablet by mouth daily.         . polymixin-bacitracin (POLYSPORIN) 500-10000 UNIT/GM OINT ointment   Topical   Apply 1 application topically as needed. For minor abrasions, scratches, or excoriated areas         . Pseudoeph-Chlorphen-DM (KIDKARE COUGH/COLD) 15-1-5 MG/5ML LIQD   Oral   Take 10 mLs by mouth every 6 (six) hours as needed. For cold symptoms         . rivastigmine (EXELON) 9.5 mg/24hr   Transdermal   Place 9.5 mg onto the skin at bedtime.         Marland Kitchen  senna (SENOKOT) 8.6 MG tablet   Oral   Take 2 tablets by mouth at bedtime as needed for constipation.          . SUNSCREEN SPF30 EX   Apply  externally   Apply topically as needed. Prior to sun exposure         . Talc (BABY POWDER EX)   Apply externally   Apply 1 application topically daily as needed (for moisture protection).         . traZODone (DESYREL) 50 MG tablet   Oral   Take 50 mg by mouth at bedtime.          . traZODone (DESYREL) 50 MG tablet   Oral   Take 50 mg by mouth at bedtime as needed for sleep. Between 12 am-2 am         . triamcinolone ointment (KENALOG) 0.1 %   Topical   Apply 1 application topically 2 (two) times daily as needed (for red, itchy skin).            Allergies Lamotrigine  Family History  Problem Relation Age of Onset  . Cancer Neg Hx   . Diabetes Neg Hx   . Heart disease Neg Hx     Social History Social History  Substance Use Topics  . Smoking status: Never Smoker   . Smokeless tobacco: Never Used  . Alcohol Use: No    Review of Systems  Constitutional: Negative for fevers. Eyes: Crossed eyes, normal for her. ENT: Negative for sore throat complaints. Cardiovascular: Negative for chest pain. Respiratory: Negative for shortness of breath. Gastrointestinal: Negative for abdominal pain, vomiting and diarrhea. Genitourinary:  Musculoskeletal: Negative for back pain. Skin: Negative for rash. Neurological: Negative for altered mental status. 10 point Review of Systems otherwise negative ____________________________________________   PHYSICAL EXAM:  VITAL SIGNS: ED Triage Vitals  Enc Vitals Group     BP 05/26/16 1259 108/78 mmHg     Pulse Rate 05/26/16 1259 54     Resp 05/26/16 1505 18     Temp 05/26/16 1505 97.7 F (36.5 C)     Temp Source 05/26/16 1505 Axillary     SpO2 05/26/16 1259 100 %     Weight --      Height --      Head Cir --      Peak Flow --      Pain Score --      Pain Loc --      Pain Edu? --      Excl. in GC? --      Constitutional: Sitting in bed, cooperative.  Well appearing and in no distress. HEENT   Head:  Normocephalic and atraumatic.      Eyes: Conjunctivae are normal. PERRL. Dysconjugate gaze      Ears:         Nose: No congestion/rhinnorhea.   Mouth/Throat: Mucous membranes are moist.   Neck: No stridor. Cardiovascular/Chest: Normal rate, regular rhythm.  No murmurs, rubs, or gallops. Respiratory: Normal respiratory effort without tachypnea nor retractions. Breath sounds are clear and equal bilaterally. No wheezes/rales/rhonchi. Gastrointestinal: Soft. No distention, no guarding, no rebound. Nontender.   Genitourinary/rectal:Deferred Musculoskeletal: Nontender with normal range of motion in all extremities. No joint effusions.  No lower extremity tenderness.  No edema. Neurologic:  No gross or focal neurologic deficits are appreciated. Skin:  Skin is warm, dry and intact. No rash noted. Psychiatric: Cooperative  ____________________________________________   EKG I, Governor Rooks, MD, the  attending physician have personally viewed and interpreted all ECGs.  None ____________________________________________  LABS (pertinent positives/negatives)  Labs Reviewed - No data to display  ____________________________________________  RADIOLOGY All Xrays were viewed by me. Imaging interpreted by Radiologist.  Chest 2 view:  IMPRESSION: No acute disease.  Possible gaseous distention of the esophagus. __________________________________________  PROCEDURES  Procedure(s) performed: None  Critical Care performed: None  ____________________________________________   ED COURSE / ASSESSMENT AND PLAN  Pertinent labs & imaging results that were available during my care of the patient were reviewed by me and considered in my medical decision making (see chart for details).   The description of the months of intermittent symptoms sound more likely cognitive to me in etiology given intermittent nature and no progression of symptoms.  She did eat applesauce here with no  problems.  I am not suspicious of new neurologic deficit such as stroke.  I am not concerned about severe stricture as patient is able to eat and no reported weight loss.  However, I will recommend they follow back up with her primary care physician who may consider gastroenterology referral.  Again, today no evidence of aspiration pneumonia clinically. It sounds to me like although she coughs and gags she probably is actually protecting her airway.  She is okay for discharge home today. Follow-up with primary care physician.    CONSULTATIONS:   None   Patient / Family / Caregiver informed of clinical course, medical decision-making process, and agree with plan.   I discussed return precautions, follow-up instructions, and discharged instructions with patient and/or family.   ___________________________________________   FINAL CLINICAL IMPRESSION(S) / ED DIAGNOSES   Final diagnoses:  Dysphagia              Note: This dictation was prepared with Dragon dictation. Any transcriptional errors that result from this process are unintentional   Governor Rooks, MD 05/26/16 1649

## 2016-05-26 NOTE — ED Notes (Signed)
Per caregiver pt with possible  Aspiration while eating breakfast this am. Caregiver state pt has been vomiting on and off since with some fever.  Pt with mental disability and is nonverbal.

## 2016-06-30 ENCOUNTER — Emergency Department
Admission: EM | Admit: 2016-06-30 | Discharge: 2016-06-30 | Disposition: A | Discharge status: Home / Self Care | Payer: Medicare Other | Attending: Emergency Medicine | Admitting: Emergency Medicine

## 2016-06-30 DIAGNOSIS — Y939 Activity, unspecified: Secondary | ICD-10-CM | POA: Insufficient documentation

## 2016-06-30 DIAGNOSIS — G309 Alzheimer's disease, unspecified: Secondary | ICD-10-CM | POA: Insufficient documentation

## 2016-06-30 DIAGNOSIS — Z79899 Other long term (current) drug therapy: Secondary | ICD-10-CM | POA: Insufficient documentation

## 2016-06-30 DIAGNOSIS — Y999 Unspecified external cause status: Secondary | ICD-10-CM | POA: Diagnosis not present

## 2016-06-30 DIAGNOSIS — S0990XA Unspecified injury of head, initial encounter: Secondary | ICD-10-CM | POA: Diagnosis present

## 2016-06-30 DIAGNOSIS — Y929 Unspecified place or not applicable: Secondary | ICD-10-CM | POA: Diagnosis not present

## 2016-06-30 DIAGNOSIS — W1839XA Other fall on same level, initial encounter: Secondary | ICD-10-CM | POA: Diagnosis not present

## 2016-06-30 DIAGNOSIS — W19XXXA Unspecified fall, initial encounter: Secondary | ICD-10-CM

## 2016-06-30 NOTE — ED Notes (Signed)
Pt care giver reports that she hit head on floor and that she was told to bring her to the er for eval - when asking how pt hit head on the floor, caregiver replied that she was not sure that pt had done this at "the day program" - caregiver states that the pt is not doing the normal things she does when she is in pain so it is assumed that she is not in any pain at this time

## 2016-06-30 NOTE — ED Provider Notes (Signed)
[facility] Emergency Department Provider Note    ____________________________________________   I have reviewed the triage vital signs and the nursing notes.   HISTORY  Chief Complaint Head Injury   History limited by patient being non verbal. History obtained from caregiver.    HPI Hannah Gardner is a 50 y.o. female with history of trisomy 46 who presents to the emergency department today after she fell during her day program. This occurred roughly 5-1/2 hours ago. It sounds like she fell from ground level. Caregiver thinks she might of tripped on her feet. There was no loss of consciousness. No vomiting since then. No signs that the patient is in any discomfort. Patient is non verbal. The patient has been acting normally for her caregiver. She is not on any blood thinners. Has no history of bleeding disorders.   Past Medical History:  Diagnosis Date  . Abnormal uterine bleeding (AUB)   . Anxiety   . Cleft lip   . Complication of anesthesia    pt was given 2 shots for sedation for a past surgery that made pt aggressive.  Mom went back with pt for last surgery, pt did well.  . Depression   . Down syndrome   . Down's syndrome   . Hypsarrhythmia (HCC)   . Irregular periods/menstrual cycles     Patient Active Problem List   Diagnosis Date Noted  . Abnormal uterine bleeding 11/15/2015  . Difficulty in walking 11/23/2014  . Seizure (HCC) 11/23/2014  . Disordered sleep 11/23/2014  . 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 AD (Alzheimer's disease) 04/05/2014  . 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 CERUMEN REMOVAL Bilateral 04/18/2015   Procedure: CERUMEN REMOVAL, Bilateral ;  Surgeon: Geanie Logan, MD;  Location: [facility] ORS;  Service: ENT;  Laterality: Bilateral;  . EAR EXAMINATION UNDER ANESTHESIA Bilateral    remove cerumen from ears  . TOOTH EXTRACTION      Prior to  Admission medications   Medication Sig Start Date End Date Taking? Authorizing Provider  acetaminophen (TYLENOL) 325 MG tablet Take 650 mg by mouth every 4 (four) hours as needed for mild pain or fever.     Historical Provider, MD  aluminum-magnesium hydroxide-simethicone (MAALOX) 200-200-20 MG/5ML SUSP Take 30 mLs by mouth every 4 (four) hours as needed (upset stomach).    Historical Provider, MD  ammonium lactate (AMLACTIN) 12 % cream Apply topically 2 (two) times daily as needed for dry skin.     Historical Provider, MD  calamine lotion Apply 1 application topically as needed for itching.    Historical Provider, MD  Calcium Carbonate-Vitamin D (CALCIUM-VITAMIN D) 500-200 MG-UNIT per tablet Take 1 tablet by mouth 2 (two) times daily.    Historical Provider, MD  diphenhydrAMINE (BENADRYL) 25 MG tablet Take 12.5-25 mg by mouth as needed for allergies. Every 4 to 6 hours PRN    Historical Provider, MD  econazole nitrate 1 % cream Apply 1 application topically 2 (two) times daily. To feet/ toes for one month    Historical Provider, MD  Emollient (DML FORTE) CREA Apply topically daily. Apply to face and dry.rough areas of skin (hands/fingers)    Historical Provider, MD  escitalopram (LEXAPRO) 10 MG tablet Take 10 mg by mouth 2 (two) times daily.    Historical Provider, MD  hydrocerin (EUCERIN) CREA Apply 1 application topically at bedtime.    Historical Provider, MD  hydrocortisone 2.5 %  cream Apply 1 application topically as needed. Apply to vulva or reddened areas for redness    Historical Provider, MD  hydrogen peroxide 1.5 % SOLN Place 4 drops into both ears See admin instructions. 2 times a month    Historical Provider, MD  Lacosamide 150 MG TABS Take 150 mg by mouth 2 (two) times daily.     Historical Provider, MD  levETIRAcetam (KEPPRA) 500 MG tablet Take 3 tablets (1,500 mg total) by mouth 2 (two) times daily. 05/09/16   Emily FilbertJonathan E Williams, MD  loperamide (IMODIUM) 2 MG capsule Take 2-4 mg by  mouth as needed for diarrhea or loose stools.     Historical Provider, MD  LORazepam (ATIVAN) 0.5 MG tablet Take 0.5 mg by mouth as needed. For agitation; may repeat in 20 minutes with RN approval    Historical Provider, MD  LORazepam (ATIVAN) 0.5 MG tablet Take 0.5 mg by mouth every 6 (six) hours as needed. Prior to family transport or on home visits    Historical Provider, MD  LORazepam (ATIVAN) 1 MG tablet Take 1 mg by mouth as needed. 1.5-2 hours prior to invasive diagnostic testing/MD visit or 30-60 minutes prior to nail care    Historical Provider, MD  magnesium hydroxide (MILK OF MAGNESIA) 400 MG/5ML suspension Take 30 mLs by mouth every three (3) days as needed for mild constipation or moderate constipation.     Historical Provider, MD  medroxyPROGESTERone (PROVERA) 10 MG tablet Take 1 tablet (10 mg total) by mouth daily. Patient taking differently: Take 10 mg by mouth every morning.  11/15/15   Prentice DockerMartin A Defrancesco, MD  memantine (NAMENDA XR) 28 MG CP24 24 hr capsule Take 28 mg by mouth at bedtime.    Historical Provider, MD  Multiple Vitamin (MULTIVITAMIN WITH MINERALS) TABS tablet Take 1 tablet by mouth daily.    Historical Provider, MD  polymixin-bacitracin (POLYSPORIN) 500-10000 UNIT/GM OINT ointment Apply 1 application topically as needed. For minor abrasions, scratches, or excoriated areas    Historical Provider, MD  Pseudoeph-Chlorphen-DM [facility](KIDKARE COUGH/COLD) 15-1-5 MG/5ML LIQD Take 10 mLs by mouth every 6 (six) hours as needed. For cold symptoms    Historical Provider, MD  rivastigmine (EXELON) 9.5 mg/24hr Place 9.5 mg onto the skin at bedtime.    Historical Provider, MD  senna (SENOKOT) 8.6 MG tablet Take 2 tablets by mouth at bedtime as needed for constipation.     Historical Provider, MD  SUNSCREEN SPF30 EX Apply topically as needed. Prior to sun exposure    Historical Provider, MD  Talc (BABY POWDER EX) Apply 1 application topically daily as needed (for moisture protection).     Historical Provider, MD  traZODone (DESYREL) 50 MG tablet Take 50 mg by mouth at bedtime.     Historical Provider, MD  traZODone (DESYREL) 50 MG tablet Take 50 mg by mouth at bedtime as needed for sleep. Between 12 am-2 am    Historical Provider, MD  triamcinolone ointment (KENALOG) 0.1 % Apply 1 application topically 2 (two) times daily as needed (for red, itchy skin).     Historical Provider, MD    Allergies Lamotrigine  Family History  Problem Relation Age of Onset  . Cancer Neg Hx   . Diabetes Neg Hx   . Heart disease Neg Hx     Social History Social History  Substance Use Topics  . Smoking status: Never Smoker  . Smokeless tobacco: Never Used  . Alcohol use No    Review of Systems Limited given  patient is non verbal ____________________________________________   PHYSICAL EXAM:  VITAL SIGNS: ED Triage Vitals   Enc Vitals Group     BP 123/69     Pulse Rate 72     Resp 18     Temp 97.7 F (36.5 C)     Temp Source Axillary     SpO2 97 %     Weight 110 lb (49.9 kg)     Height 4\' 10"  (1.473 m)  Exam limited by patient compliance.  Constitutional: Awake, alert, in no acute distress. Upset with exam. Eyes: Conjunctivae are normal. PERRL. Normal extraocular movements. ENT   Head: No signs of trauma.    Nose: No congestion/rhinnorhea.   Mouth/Throat: Mucous membranes are moist.   Neck: No stridor. No midline tenderness.  Cardiovascular: Normal rate, regular rhythm.  Respiratory: Normal respiratory effort without tachypnea nor retractions. Genitourinary: Deferred Musculoskeletal: Normal range of motion in all extremities. Neurologic:  Awake and alert. Non verbal. Moves all extremities.  Skin:  Skin is warm, dry and intact. No rash noted. ____________________________________________    LABS (pertinent positives/negatives)  None  ____________________________________________   EKG  None  ____________________________________________     RADIOLOGY  None  ____________________________________________   PROCEDURES  Procedures  ____________________________________________   INITIAL IMPRESSION / ASSESSMENT AND PLAN / ED COURSE  Pertinent labs & imaging results that were available during my care of the patient were reviewed by me and considered in my medical decision making (see chart for details).  Patient presented to the emergency department and after ground-level fall with no loss of consciousness. Patient is been acting normally without any vomiting since then. The fall happened roughly 5-1/2 hours ago. No signs of trauma on exam. At this point have very low suspicion for clinically important head injury. Do not think patient requires emergent neuroimaging. Did discuss return precautions with the caregiver.  ____________________________________________   FINAL CLINICAL IMPRESSION(S) / ED DIAGNOSES  Final diagnoses:  Fall, initial encounter     Note: This dictation was prepared with Nurse, children'sDragon dictation. Any transcriptional errors that result from this process are unintentional    Phineas SemenGraydon Anson Peddie, MD 06/30/16 [phone]

## 2016-06-30 NOTE — ED Triage Notes (Signed)
Pt is here with cargivers, states it was reported that the pt fell at workshop today and hit her head.. Denies LOC, pt is acting at her baseline.. Small hematoma to the forehead..Marland Kitchen

## 2016-06-30 NOTE — Discharge Instructions (Signed)
As we discussed, if Ms. Hannah Gardner starts to behave abnormally, has persistent vomiting, unusual seizures or persistent seizures or any other new or concerning symptoms, please have her return to the emergency department.

## 2016-07-31 ENCOUNTER — Encounter: Payer: Self-pay | Admitting: *Deleted

## 2016-08-04 NOTE — Discharge Instructions (Signed)

## 2016-08-05 ENCOUNTER — Ambulatory Visit: Payer: Medicare Other | Admitting: Anesthesiology

## 2016-08-05 ENCOUNTER — Encounter
Admission: RE | Disposition: A | Discharge status: Home / Self Care | Payer: Self-pay | Source: Ambulatory Visit | Attending: Otolaryngology

## 2016-08-05 ENCOUNTER — Ambulatory Visit
Admission: RE | Admit: 2016-08-05 | Discharge: 2016-08-05 | Disposition: A | Discharge status: Home / Self Care | Payer: Medicare Other | Source: Ambulatory Visit | Attending: Otolaryngology | Admitting: Otolaryngology

## 2016-08-05 DIAGNOSIS — H6123 Impacted cerumen, bilateral: Secondary | ICD-10-CM | POA: Insufficient documentation

## 2016-08-05 DIAGNOSIS — Z79890 Hormone replacement therapy (postmenopausal): Secondary | ICD-10-CM | POA: Insufficient documentation

## 2016-08-05 DIAGNOSIS — Z79899 Other long term (current) drug therapy: Secondary | ICD-10-CM | POA: Diagnosis not present

## 2016-08-05 DIAGNOSIS — F79 Unspecified intellectual disabilities: Secondary | ICD-10-CM | POA: Diagnosis not present

## 2016-08-05 HISTORY — DX: Unspecified dementia, unspecified severity, without behavioral disturbance, psychotic disturbance, mood disturbance, and anxiety: F03.90

## 2016-08-05 HISTORY — PX: CERUMEN REMOVAL: SHX6571

## 2016-08-05 HISTORY — DX: Unspecified convulsions: R56.9

## 2016-08-05 SURGERY — EXAM UNDER ANESTHESIA
Anesthesia: General | Site: Ear | Laterality: Bilateral | Wound class: Clean Contaminated

## 2016-08-05 SURGICAL SUPPLY — 9 items
BLADE MYR LANCE NRW W/HDL (BLADE) IMPLANT
CANISTER SUCT 1200ML W/VALVE (MISCELLANEOUS) ×3 IMPLANT
COTTONBALL LRG STERILE PKG (GAUZE/BANDAGES/DRESSINGS) IMPLANT
GLOVE BIO SURGEON STRL SZ7.5 (GLOVE) ×6 IMPLANT
KIT ROOM TURNOVER OR (KITS) ×3 IMPLANT
STRAP BODY AND KNEE 60X3 (MISCELLANEOUS) ×3 IMPLANT
TOWEL OR 17X26 4PK STRL BLUE (TOWEL DISPOSABLE) ×3 IMPLANT
TUBING CONN 6MMX3.1M (TUBING) ×2
TUBING SUCTION CONN 0.25 STRL (TUBING) ×1 IMPLANT

## 2016-08-05 NOTE — Anesthesia Preprocedure Evaluation (Signed)
Anesthesia Evaluation  Patient identified by MRN, date of birth, ID band Patient awake  General Assessment Comment:Mentally challenged and uncooperative. Hx from family/caregivers. They report no problems with prior anesthetics. NKDA, NPO.  Reviewed: Allergy & Precautions, NPO status , Patient's Chart, lab work & pertinent test results  Airway Mallampati: II  TM Distance: >3 FB Neck ROM: Full    Dental  (+) Teeth Intact   Pulmonary          Cardiovascular     Neuro/Psych    GI/Hepatic   Endo/Other    Renal/GU      Musculoskeletal   Abdominal (+)  Abdomen: soft.    Peds  Hematology   Anesthesia Other Findings   Reproductive/Obstetrics On provera.                             Anesthesia Physical Anesthesia Plan  ASA: III  Anesthesia Plan: General   Post-op Pain Management:    Induction: Inhalational  Airway Management Planned: Mask  Additional Equipment:   Intra-op Plan:   Post-operative Plan:   Informed Consent: I have reviewed the patients History and Physical, chart, labs and discussed the procedure including the risks, benefits and alternatives for the proposed anesthesia with the patient or authorized representative who has indicated his/her understanding and acceptance.     Plan Discussed with: CRNA  Anesthesia Plan Comments:         Anesthesia Quick Evaluation  

## 2016-08-05 NOTE — H&P (Signed)
History and physical reviewed and will be scanned in later. No change in medical status reported by the patient or family, appears stable for surgery. All questions regarding the procedure answered, and patient (or family if a child) expressed understanding of the procedure.  Hannah Gardner S @TODAY@ 

## 2016-08-05 NOTE — Anesthesia Procedure Notes (Signed)
Performed by: Wyatt Thorstenson Pre-anesthesia Checklist: Patient identified, Emergency Drugs available, Suction available, Timeout performed and Patient being monitored Patient Re-evaluated:Patient Re-evaluated prior to inductionOxygen Delivery Method: Circle system utilized Preoxygenation: Pre-oxygenation with 100% oxygen Intubation Type: Inhalational induction Ventilation: Mask ventilation without difficulty and Mask ventilation throughout procedure Dental Injury: Teeth and Oropharynx as per pre-operative assessment        

## 2016-08-05 NOTE — Anesthesia Postprocedure Evaluation (Signed)
Anesthesia Post Note  Patient: Hannah Gardner  Procedure(s) Performed: Procedure(s) (LRB): EXAM OF EARS UNDER ANESTHESIA (Bilateral) CERUMEN REMOVAL (Bilateral)  Patient location during evaluation: PACU Anesthesia Type: General Level of consciousness: awake and alert Pain management: pain level controlled Vital Signs Assessment: post-procedure vital signs reviewed and stable Respiratory status: spontaneous breathing, nonlabored ventilation, respiratory function stable and patient connected to nasal cannula oxygen Cardiovascular status: blood pressure returned to baseline and stable Postop Assessment: no signs of nausea or vomiting Anesthetic complications: no    Durene Fruitshomas,  Dishon Kehoe G

## 2016-08-05 NOTE — Op Note (Signed)
08/05/2016  7:52 AM    Hannah HoraSusan Gardner  [id number]   Pre-Op Diagnosis:  Bilateral cerumen impaction  Post-op Diagnosis: SAME  Procedure: Bilateral cerumen removal with microscopy under anesthesia  Surgeon:  Sandi MealyBennett, Hannah Buch S., MD  Anesthesia:  General anesthesia with masked ventilation  EBL:  None  Complications:  None  Findings: heavy cerumen impaction AU, middle ears clear  Procedure: The patient was taken to the Operating Room and placed in the supine position.  After induction of general anesthesia with mask ventilation, the right ear was evaluated under the operating microscope and the canal cleaned of impacted cerumen with a curette and a suction. The TM was clear. The same procedure was then performed on the left ear.  The patient was then returned to the anesthesiologist for awakening, and was taken to the Recovery Room in stable condition.  Cultures:  None.  Disposition:   PACU then discharge home  Plan: f/u in 6 months  Sandi MealyBennett, Hannah Robideau S 08/05/2016 7:52 AM

## 2016-08-05 NOTE — Transfer of Care (Signed)
Immediate Anesthesia Transfer of Care Note  Patient: Hannah HoraSusan Gardner  Procedure(s) Performed: Procedure(s) with comments: EXAM OF EARS UNDER ANESTHESIA (Bilateral) - DOWNS SYNDROME 1ST CASE CERUMEN REMOVAL (Bilateral)  Patient Location: PACU  Anesthesia Type: General  Level of Consciousness: awake, alert  and patient cooperative  Airway and Oxygen Therapy: Patient Spontanous Breathing and Patient connected to supplemental oxygen  Post-op Assessment: Post-op Vital signs reviewed, Patient's Cardiovascular Status Stable, Respiratory Function Stable, Patent Airway and No signs of Nausea or vomiting  Post-op Vital Signs: Reviewed and stable  Complications: No apparent anesthesia complications

## 2016-08-12 ENCOUNTER — Encounter: Payer: Self-pay | Admitting: Otolaryngology

## 2016-10-24 ENCOUNTER — Other Ambulatory Visit: Payer: Self-pay

## 2016-10-24 MED ORDER — MEDROXYPROGESTERONE ACETATE 10 MG PO TABS: 10.0000 mg | ORAL_TABLET | ORAL | 1 refills | Status: DC

## 2016-11-17 NOTE — Progress Notes (Signed)
Chief complaint: 1.  Annual gynecologic physical.  The patient is a 51 year old white female, para 0, with trisomy 1921, on Provera 10 mg daily for maintenance of amenorrhea and hygiene, not sexually active, who presents for gynecologic physical. There is no history of abnormal uterine bleeding over the past year. No history of STI. No history of vaginal discharge or perineal irritation.   The patient has not had any recent mammogram. No recent Pap smear.  Past Medical History:  Diagnosis Date  . Abnormal uterine bleeding (AUB)   . Anxiety   . Cleft lip   . Complication of anesthesia    pt was given 2 shots for sedation for a past surgery that made pt aggressive.  Mom went back with pt for last surgery, pt did well.  . Dementia   . Depression   . Down's syndrome   . Hypsarrhythmia (HCC)   . Irregular periods/menstrual cycles   . Seizures (HCC)    last episode 07/19/16.  2-3x/mo   Past Surgical History:  Procedure Laterality Date  . CERUMEN REMOVAL Bilateral 04/18/2015   Procedure: CERUMEN REMOVAL, Bilateral ;  Surgeon: Geanie LoganPaul Bennett, MD;  Location: [facility] ORS;  Service: ENT;  Laterality: Bilateral;  . CERUMEN REMOVAL Bilateral 08/05/2016   Procedure: CERUMEN REMOVAL;  Surgeon: Geanie LoganPaul Bennett, MD;  [facility];  Service: ENT;  Laterality: Bilateral;  . EAR EXAMINATION UNDER ANESTHESIA Bilateral    remove cerumen from ears  . TOOTH EXTRACTION      Review of systems: Not obtainable from patient.  OBJECTIVE: LMP  (LMP Unknown) Comment: pt mother states no period in last year  BP (!) 88/59   Ht 4\' 11"  (1.499 m)   Wt 90 lb 8 oz (41.1 kg)   LMP  (LMP Unknown) Comment: pt mother states no period in last year  BMI 18.28 kg/m  Docile white female in no acute distress (medicated with Ativan) Neck: Nontender, no organomegaly or lymphadenopathy. Lungs: Clear. Heart: Regular rate and rhythm without murmur. Breasts: Right-fibrocystic change/thickening upper outer quadrant;  no skin changes, no nipple discharge; no adenopathy Left-palpable masses adenopathy or nipple discharge Abdomen: Soft, nontender, without organomegaly. Pelvic:  External genitalia-normal.  BUS-normal.  Vagina-Introitus 7 mm; unable to use speculum; blind Pap smear taken  Bimanual exam-unable to perform vaginally  Rectovaginal-normal External exam; no rectal masses; normal sphincter tone; no palpable pelvic masses EXTREMITIES: Without edema; Clubbing present Skin: No rash, no ulceration  ASSESSMENT: 1.  Annual gynecologic exam  2.  Trisomy 21. 3.  Remote history of abnormal uterine bleeding; amenorrhea on Provera 10 mg daily. 4. Fibrocystic change/thickening upper outer quadrant right breast  PLAN: 1.  Discontinue Provera 2.  Diagnostic mammogram diagnostic ultrasound due to palpable thickening/fibrocystic change in her right breast upper outer quadrant 3. Recommend colonoscopy screening 4. Blind Pap smear completed 5. Notify our office if patient develops vaginal bleeding following discontinuation of Provera 6. Return in 1 year  Kylan Liberati MadisonMiller, CMA  Herold HarmsMartin A Defrancesco, MD   Note: This dictation was prepared with Dragon dictation along with smaller phrase technology. Any transcriptional errors that result from this process are unintentional.

## 2016-11-19 ENCOUNTER — Encounter: Payer: Self-pay | Admitting: Obstetrics and Gynecology

## 2016-11-19 ENCOUNTER — Ambulatory Visit (INDEPENDENT_AMBULATORY_CARE_PROVIDER_SITE_OTHER): Payer: Medicare Other | Admitting: Obstetrics and Gynecology

## 2016-11-19 VITALS — BP 88/59 | Ht 59.0 in | Wt 90.5 lb

## 2016-11-19 DIAGNOSIS — Z01419 Encounter for gynecological examination (general) (routine) without abnormal findings: Secondary | ICD-10-CM

## 2016-11-19 DIAGNOSIS — Q909 Down syndrome, unspecified: Secondary | ICD-10-CM | POA: Diagnosis not present

## 2016-11-19 DIAGNOSIS — N6311 Unspecified lump in the right breast, upper outer quadrant: Secondary | ICD-10-CM | POA: Insufficient documentation

## 2016-11-19 NOTE — Addendum Note (Signed)
Addended by: Marchelle FolksMILLER, Kamden Reber G on: 11/19/2016 12:09 PM   Modules accepted: Orders

## 2016-11-19 NOTE — Patient Instructions (Signed)
1. Pap smear (blind) is done 2. Recommend colonoscopy screening-age 51. Rectal exam was normal today. 3. Recommend diagnostic ultrasound and diagnostic mammogram (patient has thickening upper outer quadrant right breast on exam) 4. Stop Provera 5. Watch for vaginal bleeding-it may occur within the first 2 weeks of discontinuing Provera. Notify the office if there is any vaginal bleeding 6. Return in 1 year   Health Maintenance, Female Introduction Adopting a healthy lifestyle and getting preventive care can go a long way to promote health and wellness. Talk with your health care provider about what schedule of regular examinations is right for you. This is a good chance for you to check in with your provider about disease prevention and staying healthy. In between checkups, there are plenty of things you can do on your own. Experts have done a lot of research about which lifestyle changes and preventive measures are most likely to keep you healthy. Ask your health care provider for more information. Weight and diet Eat a healthy diet  Be sure to include plenty of vegetables, fruits, low-fat dairy products, and lean protein.  Do not eat a lot of foods high in solid fats, added sugars, or salt.  Get regular exercise. This is one of the most important things you can do for your health.  Most adults should exercise for at least 150 minutes each week. The exercise should increase your heart rate and make you sweat (moderate-intensity exercise).  Most adults should also do strengthening exercises at least twice a week. This is in addition to the moderate-intensity exercise. Maintain a healthy weight  Body mass index (BMI) is a measurement that can be used to identify possible weight problems. It estimates body fat based on height and weight. Your health care provider can help determine your BMI and help you achieve or maintain a healthy weight.  For females 54 years of age and older:  A BMI  below 18.5 is considered underweight.  A BMI of 18.5 to 24.9 is normal.  A BMI of 25 to 29.9 is considered overweight.  A BMI of 30 and above is considered obese. Watch levels of cholesterol and blood lipids  You should start having your blood tested for lipids and cholesterol at 51 years of age, then have this test every 5 years.  You may need to have your cholesterol levels checked more often if:  Your lipid or cholesterol levels are high.  You are older than 51 years of age.  You are at high risk for heart disease. Cancer screening Lung Cancer  Lung cancer screening is recommended for adults 75-28 years old who are at high risk for lung cancer because of a history of smoking.  A yearly low-dose CT scan of the lungs is recommended for people who:  Currently smoke.  Have quit within the past 15 years.  Have at least a 30-pack-year history of smoking. A pack year is smoking an average of one pack of cigarettes a day for 1 year.  Yearly screening should continue until it has been 15 years since you quit.  Yearly screening should stop if you develop a health problem that would prevent you from having lung cancer treatment. Breast Cancer  Practice breast self-awareness. This means understanding how your breasts normally appear and feel.  It also means doing regular breast self-exams. Let your health care provider know about any changes, no matter how small.  If you are in your 20s or 30s, you should have a clinical breast  exam (CBE) by a health care provider every 1-3 years as part of a regular health exam.  If you are 18 or older, have a CBE every year. Also consider having a breast X-ray (mammogram) every year.  If you have a family history of breast cancer, talk to your health care provider about genetic screening.  If you are at high risk for breast cancer, talk to your health care provider about having an MRI and a mammogram every year.  Breast cancer gene (BRCA)  assessment is recommended for women who have family members with BRCA-related cancers. BRCA-related cancers include:  Breast.  Ovarian.  Tubal.  Peritoneal cancers.  Results of the assessment will determine the need for genetic counseling and BRCA1 and BRCA2 testing. Cervical Cancer  Your health care provider may recommend that you be screened regularly for cancer of the pelvic organs (ovaries, uterus, and vagina). This screening involves a pelvic examination, including checking for microscopic changes to the surface of your cervix (Pap test). You may be encouraged to have this screening done every 3 years, beginning at age 57.  For women ages 31-65, health care providers may recommend pelvic exams and Pap testing every 3 years, or they may recommend the Pap and pelvic exam, combined with testing for human papilloma virus (HPV), every 5 years. Some types of HPV increase your risk of cervical cancer. Testing for HPV may also be done on women of any age with unclear Pap test results.  Other health care providers may not recommend any screening for nonpregnant women who are considered low risk for pelvic cancer and who do not have symptoms. Ask your health care provider if a screening pelvic exam is right for you.  If you have had past treatment for cervical cancer or a condition that could lead to cancer, you need Pap tests and screening for cancer for at least 20 years after your treatment. If Pap tests have been discontinued, your risk factors (such as having a new sexual partner) need to be reassessed to determine if screening should resume. Some women have medical problems that increase the chance of getting cervical cancer. In these cases, your health care provider may recommend more frequent screening and Pap tests. Colorectal Cancer  This type of cancer can be detected and often prevented.  Routine colorectal cancer screening usually begins at 51 years of age and continues through 51  years of age.  Your health care provider may recommend screening at an earlier age if you have risk factors for colon cancer.  Your health care provider may also recommend using home test kits to check for hidden blood in the stool.  A small camera at the end of a tube can be used to examine your colon directly (sigmoidoscopy or colonoscopy). This is done to check for the earliest forms of colorectal cancer.  Routine screening usually begins at age 65.  Direct examination of the colon should be repeated every 5-10 years through 51 years of age. However, you may need to be screened more often if early forms of precancerous polyps or small growths are found. Skin Cancer  Check your skin from head to toe regularly.  Tell your health care provider about any new moles or changes in moles, especially if there is a change in a mole's shape or color.  Also tell your health care provider if you have a mole that is larger than the size of a pencil eraser.  Always use sunscreen. Apply sunscreen liberally and  repeatedly throughout the day.  Protect yourself by wearing long sleeves, pants, a wide-brimmed hat, and sunglasses whenever you are outside. Heart disease, diabetes, and high blood pressure  High blood pressure causes heart disease and increases the risk of stroke. High blood pressure is more likely to develop in:  People who have blood pressure in the high end of the normal range (130-139/85-89 mm Hg).  People who are overweight or obese.  People who are African American.  If you are 1-105 years of age, have your blood pressure checked every 3-5 years. If you are 72 years of age or older, have your blood pressure checked every year. You should have your blood pressure measured twice-once when you are at a hospital or clinic, and once when you are not at a hospital or clinic. Record the average of the two measurements. To check your blood pressure when you are not at a hospital or clinic,  you can use:  An automated blood pressure machine at a pharmacy.  A home blood pressure monitor.  If you are between 17 years and 24 years old, ask your health care provider if you should take aspirin to prevent strokes.  Have regular diabetes screenings. This involves taking a blood sample to check your fasting blood sugar level.  If you are at a normal weight and have a low risk for diabetes, have this test once every three years after 51 years of age.  If you are overweight and have a high risk for diabetes, consider being tested at a younger age or more often. Preventing infection Hepatitis B  If you have a higher risk for hepatitis B, you should be screened for this virus. You are considered at high risk for hepatitis B if:  You were born in a country where hepatitis B is common. Ask your health care provider which countries are considered high risk.  Your parents were born in a high-risk country, and you have not been immunized against hepatitis B (hepatitis B vaccine).  You have HIV or AIDS.  You use needles to inject street drugs.  You live with someone who has hepatitis B.  You have had sex with someone who has hepatitis B.  You get hemodialysis treatment.  You take certain medicines for conditions, including cancer, organ transplantation, and autoimmune conditions. Hepatitis C  Blood testing is recommended for:  Everyone born from [birth year] through 1965.  Anyone with known risk factors for hepatitis C. Sexually transmitted infections (STIs)  You should be screened for sexually transmitted infections (STIs) including gonorrhea and chlamydia if:  You are sexually active and are younger than 51 years of age.  You are older than 51 years of age and your health care provider tells you that you are at risk for this type of infection.  Your sexual activity has changed since you were last screened and you are at an increased risk for chlamydia or gonorrhea. Ask your  health care provider if you are at risk.  If you do not have HIV, but are at risk, it may be recommended that you take a prescription medicine daily to prevent HIV infection. This is called pre-exposure prophylaxis (PrEP). You are considered at risk if:  You are sexually active and do not regularly use condoms or know the HIV status of your partner(s).  You take drugs by injection.  You are sexually active with a partner who has HIV. Talk with your health care provider about whether you are at high risk  of being infected with HIV. If you choose to begin PrEP, you should first be tested for HIV. You should then be tested every 3 months for as long as you are taking PrEP. Pregnancy  If you are premenopausal and you may become pregnant, ask your health care provider about preconception counseling.  If you may become pregnant, take 400 to 800 micrograms (mcg) of folic acid every day.  If you want to prevent pregnancy, talk to your health care provider about birth control (contraception). Osteoporosis and menopause  Osteoporosis is a disease in which the bones lose minerals and strength with aging. This can result in serious bone fractures. Your risk for osteoporosis can be identified using a bone density scan.  If you are 46 years of age or older, or if you are at risk for osteoporosis and fractures, ask your health care provider if you should be screened.  Ask your health care provider whether you should take a calcium or vitamin D supplement to lower your risk for osteoporosis.  Menopause may have certain physical symptoms and risks.  Hormone replacement therapy may reduce some of these symptoms and risks. Talk to your health care provider about whether hormone replacement therapy is right for you. Follow these instructions at home:  Schedule regular health, dental, and eye exams.  Stay current with your immunizations.  Do not use any tobacco products including cigarettes, chewing  tobacco, or electronic cigarettes.  If you are pregnant, do not drink alcohol.  If you are breastfeeding, limit how much and how often you drink alcohol.  Limit alcohol intake to no more than 1 drink per day for nonpregnant women. One drink equals 12 ounces of beer, 5 ounces of wine, or 1 ounces of hard liquor.  Do not use street drugs.  Do not share needles.  Ask your health care provider for help if you need support or information about quitting drugs.  Tell your health care provider if you often feel depressed.  Tell your health care provider if you have ever been abused or do not feel safe at home. This information is not intended to replace advice given to you by your health care provider. Make sure you discuss any questions you have with your health care provider. Document Released: 05/12/2011 Document Revised: 04/03/2016 Document Reviewed: 07/31/2015  2017 [employer]

## 2016-11-20 LAB — PAP IG W/ RFLX HPV ASCU: PAP SMEAR COMMENT: 0

## 2016-12-18 ENCOUNTER — Other Ambulatory Visit: Payer: Medicare Other

## 2016-12-18 ENCOUNTER — Ambulatory Visit: Payer: Medicare Other

## 2016-12-30 ENCOUNTER — Other Ambulatory Visit: Payer: Self-pay | Admitting: Obstetrics and Gynecology

## 2016-12-30 ENCOUNTER — Ambulatory Visit
Admission: RE | Admit: 2016-12-30 | Discharge: 2016-12-30 | Disposition: A | Discharge status: Home / Self Care | Payer: Medicare Other | Source: Ambulatory Visit | Attending: Obstetrics and Gynecology | Admitting: Obstetrics and Gynecology

## 2016-12-30 DIAGNOSIS — N6311 Unspecified lump in the right breast, upper outer quadrant: Secondary | ICD-10-CM | POA: Insufficient documentation

## 2017-01-14 DIAGNOSIS — E441 Mild protein-calorie malnutrition: Secondary | ICD-10-CM | POA: Insufficient documentation

## 2017-04-23 ENCOUNTER — Emergency Department: Payer: Medicare Other

## 2017-04-23 ENCOUNTER — Emergency Department
Admission: EM | Admit: 2017-04-23 | Discharge: 2017-04-23 | Disposition: A | Discharge status: Home / Self Care | Payer: Medicare Other | Attending: Emergency Medicine | Admitting: Emergency Medicine

## 2017-04-23 DIAGNOSIS — G309 Alzheimer's disease, unspecified: Secondary | ICD-10-CM | POA: Insufficient documentation

## 2017-04-23 DIAGNOSIS — Q909 Down syndrome, unspecified: Secondary | ICD-10-CM | POA: Diagnosis not present

## 2017-04-23 DIAGNOSIS — Z79899 Other long term (current) drug therapy: Secondary | ICD-10-CM | POA: Insufficient documentation

## 2017-04-23 DIAGNOSIS — R569 Unspecified convulsions: Secondary | ICD-10-CM

## 2017-04-23 DIAGNOSIS — J690 Pneumonitis due to inhalation of food and vomit: Secondary | ICD-10-CM | POA: Diagnosis not present

## 2017-04-23 DIAGNOSIS — F79 Unspecified intellectual disabilities: Secondary | ICD-10-CM | POA: Diagnosis not present

## 2017-04-23 DIAGNOSIS — G40909 Epilepsy, unspecified, not intractable, without status epilepticus: Secondary | ICD-10-CM | POA: Diagnosis not present

## 2017-04-23 LAB — URINALYSIS, COMPLETE (UACMP) WITH MICROSCOPIC
Bacteria, UA: NONE SEEN
Bilirubin Urine: NEGATIVE
GLUCOSE, UA: NEGATIVE mg/dL
HGB URINE DIPSTICK: NEGATIVE
KETONES UR: NEGATIVE mg/dL
LEUKOCYTES UA: NEGATIVE
Nitrite: NEGATIVE
PROTEIN: NEGATIVE mg/dL
Specific Gravity, Urine: 1.025 (ref 1.005–1.030)
pH: 6 (ref 5.0–8.0)

## 2017-04-23 LAB — COMPREHENSIVE METABOLIC PANEL
ALBUMIN: 3.1 g/dL — AB (ref 3.5–5.0)
ALT: 19 U/L (ref 14–54)
ANION GAP: 5 (ref 5–15)
AST: 30 U/L (ref 15–41)
Alkaline Phosphatase: 106 U/L (ref 38–126)
BUN: 18 mg/dL (ref 6–20)
CO2: 34 mmol/L — AB (ref 22–32)
Calcium: 8.9 mg/dL (ref 8.9–10.3)
Chloride: 104 mmol/L (ref 101–111)
Creatinine, Ser: 0.79 mg/dL (ref 0.44–1.00)
GFR calc Af Amer: >60 mL/min (ref >60)
GFR calc non Af Amer: >60 mL/min (ref >60)
GLUCOSE: 69 mg/dL (ref 65–99)
POTASSIUM: 4.1 mmol/L (ref 3.5–5.1)
SODIUM: 143 mmol/L (ref 135–145)
Total Bilirubin: 0.5 mg/dL (ref 0.3–1.2)
Total Protein: 7 g/dL (ref 6.5–8.1)

## 2017-04-23 LAB — CBC
HEMATOCRIT: 40 % (ref 35.0–47.0)
HEMOGLOBIN: 13.7 g/dL (ref 12.0–16.0)
MCH: 36.1 pg — ABNORMAL HIGH (ref 26.0–34.0)
MCHC: 34.2 g/dL (ref 32.0–36.0)
MCV: 105.5 fL — ABNORMAL HIGH (ref 80.0–100.0)
Platelets: 196 10*3/uL (ref 150–440)
RBC: 3.79 MIL/uL — ABNORMAL LOW (ref 3.80–5.20)
RDW: 12.9 % (ref 11.5–14.5)
WBC: 4.4 10*3/uL (ref 3.6–11.0)

## 2017-04-23 MED ORDER — AMOXICILLIN-POT CLAVULANATE 875-125 MG PO TABS
ORAL_TABLET | ORAL | Status: AC
  Filled 2017-04-23: qty 1

## 2017-04-23 MED ORDER — AMOXICILLIN-POT CLAVULANATE 875-125 MG PO TABS: 1.0000 | ORAL_TABLET | Freq: Two times a day (BID) | ORAL | 0 refills | Status: AC

## 2017-04-23 MED ORDER — AMOXICILLIN-POT CLAVULANATE 875-125 MG PO TABS
1.0000 | ORAL_TABLET | Freq: Once | ORAL | Status: AC
  Administered 2017-04-23: 1 via ORAL

## 2017-04-23 NOTE — ED Notes (Signed)
Per staff pt has been dry cough x1wk and 2 seizures within 24hrs. Staff say pt has + fall and head injury with fall during seizures. Per staff pt has been off baseline x1wk

## 2017-04-23 NOTE — ED Notes (Signed)
Unable to obtain accurate BP due to pt moving and coughing. Family aware and does not want to continue to try and take BP

## 2017-04-23 NOTE — ED Triage Notes (Signed)
Pt presents with caregiver who states the pt has had 2-3 seizures in the past couple of days and is having sx concerning difficulty/pain with swallowing over the past 2 days, holding her head back, grunting, not eating as normal.. Pt has MR and is unable to give any Hx.

## 2017-04-23 NOTE — ED Provider Notes (Signed)
[facility] Emergency Department Provider Note  Time seen: 8:47 AM  I have reviewed the triage vital signs and the nursing notes.   HISTORY  Chief Complaint Seizures and Dysphagia    HPI Hannah Gardner is a 51 y.o. female with a past medical history of mental retardation, Down syndrome, seizure disorder, presents to the emergency department for increased seizures. According to the patient's caregiver the patient normally has a seizure every few months, but has had 2 possibly 3 seizures over the past one week. They also state the patient appears to be more agitated over the past one week. Patient has difficulty swallowing somewhat at baseline but this appears to be worse over the past one week now with occasional cough/clearing her throat which they state is not her baseline. They deny any known fever. Patient sees Dr. Malvin JohnsPotter of neurology, they attempted to call his office but he is out of the office until Monday so they came to the emergency department. Patient is unable to contribute to her history.  Past Medical History:  Diagnosis Date  . Abnormal uterine bleeding (AUB)   . Anxiety   . Cleft lip   . Complication of anesthesia    pt was given 2 shots for sedation for a past surgery that made pt aggressive.  Mom went back with pt for last surgery, pt did well.  . Dementia   . Depression   . Down's syndrome   . Hypsarrhythmia (HCC)   . Irregular periods/menstrual cycles   . Seizures (HCC)    last episode 07/19/16.  2-3x/mo    Patient Active Problem List   Diagnosis Date Noted  . Mass of upper outer quadrant of right breast 11/19/2016  . Difficulty in walking 11/23/2014  . Seizure (HCC) 11/23/2014  . Disordered sleep 11/23/2014  . 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 AD (Alzheimer's disease) 04/05/2014  . 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 CERUMEN REMOVAL Bilateral  04/18/2015   Procedure: CERUMEN REMOVAL, Bilateral ;  Surgeon: Geanie LoganPaul Bennett, MD;  Location: [facility] ORS;  Service: ENT;  Laterality: Bilateral;  . CERUMEN REMOVAL Bilateral 08/05/2016   Procedure: CERUMEN REMOVAL;  Surgeon: Geanie LoganPaul Bennett, MD;  [facility];  Service: ENT;  Laterality: Bilateral;  . EAR EXAMINATION UNDER ANESTHESIA Bilateral    remove cerumen from ears  . TOOTH EXTRACTION      Prior to Admission medications   Medication Sig Start Date End Date Taking? Authorizing Provider  acetaminophen (TYLENOL) 325 MG tablet Take 650 mg by mouth every 4 (four) hours as needed for mild pain or fever.     [provider]  aluminum-magnesium hydroxide-simethicone (MAALOX) 200-200-20 MG/5ML SUSP Take 30 mLs by mouth every 4 (four) hours as needed (upset stomach).    [provider]  ammonium lactate (AMLACTIN) 12 % cream Apply topically 2 (two) times daily as needed for dry skin.     [provider]  calamine lotion Apply 1 application topically as needed for itching.    [provider]  Calcium Carbonate-Vitamin D (CALCIUM-VITAMIN D) 500-200 MG-UNIT per tablet Take 1 tablet by mouth 2 (two) times daily.    [provider]  diphenhydrAMINE (BENADRYL) 25 MG tablet Take 12.5-25 mg by mouth as needed for allergies. Every 4 to 6 hours PRN    [provider]  divalproex (DEPAKOTE) 250 MG DR tablet TAKE 1 TABLET BY MOUTH TWICE DAILY  FOR MOOD 08/27/16   [provider]  econazole nitrate 1 % cream Apply 1 application topically 2 (two) times daily. To feet/ toes for one month    [provider]  escitalopram (LEXAPRO) 10 MG tablet Take 10 mg by mouth 2 (two) times daily.    [provider]  hydrocerin (EUCERIN) CREA Apply 1 application topically at bedtime.    [provider]  hydrocortisone 2.5 % cream Apply topically 2 (two) times daily.    [provider]  hydrogen peroxide 1.5 % SOLN Place 4  drops into both ears See admin instructions. 2 times a month    [provider]  levETIRAcetam (KEPPRA) 500 MG tablet Take 3 tablets (1,500 mg total) by mouth 2 (two) times daily. 05/09/16   Emily Filbert, MD  loperamide (IMODIUM) 2 MG capsule Take 2-4 mg by mouth as needed for diarrhea or loose stools.     [provider]  LORazepam (ATIVAN) 0.5 MG tablet Take 0.5 mg by mouth as needed. For agitation; may repeat in 20 minutes with RN approval    [provider]  LORazepam (ATIVAN) 0.5 MG tablet Take 0.5 mg by mouth every 6 (six) hours as needed. Prior to family transport or on home visits    [provider]  LORazepam (ATIVAN) 1 MG tablet Take 1 mg by mouth as needed. 1.5-2 hours prior to invasive diagnostic testing/MD visit or 30-60 minutes prior to nail care    [provider]  magnesium hydroxide (MILK OF MAGNESIA) 400 MG/5ML suspension Take 30 mLs by mouth every three (3) days as needed for mild constipation or moderate constipation.     [provider]  memantine (NAMENDA XR) 28 MG CP24 24 hr capsule Take 28 mg by mouth at bedtime.    [provider]  Multiple Vitamin (MULTIVITAMIN WITH MINERALS) TABS tablet Take 1 tablet by mouth daily.    [provider]  polymixin-bacitracin (POLYSPORIN) 500-10000 UNIT/GM OINT ointment Apply 1 application topically as needed. For minor abrasions, scratches, or excoriated areas    [provider]  Pseudoeph-Chlorphen-DM [facility] COUGH/COLD) 15-1-5 MG/5ML LIQD Take 10 mLs by mouth every 6 (six) hours as needed. For cold symptoms    [provider]  rivastigmine (EXELON) 9.5 mg/24hr Place 9.5 mg onto the skin daily.    [provider]  senna (SENOKOT) 8.6 MG tablet Take by mouth.    [provider]  SUNSCREEN SPF30 EX Apply topically as needed. Prior to sun exposure    [provider]  Talc (BABY POWDER EX) Apply 1 application topically daily  as needed (for moisture protection).    [provider]  traZODone (DESYREL) 50 MG tablet Take 50 mg by mouth at bedtime as needed for sleep. Between 12 am-2 am    [provider]  triamcinolone ointment (KENALOG) 0.1 % Apply 1 application topically 2 (two) times daily as needed (for red, itchy skin).     [provider]  VIMPAT 150 MG TABS  10/23/16   [provider]    Allergies  Allergen Reactions  . Lamotrigine Rash    Rash on torso and arms    Family History  Problem Relation Age of Onset  . Cancer Neg Hx   . Diabetes Neg Hx   . Heart disease Neg Hx     Social History Social History  Substance Use Topics  . Smoking status: Never Smoker  . Smokeless tobacco: Never Used  .  Alcohol use No    Review of Systems Unable to obtain an adequate review of systems due to baseline mental retardation  ____________________________________________   PHYSICAL EXAM:  VITAL SIGNS: ED Triage Vitals [04/23/17 0826]  Enc Vitals Group     BP 100/79     Pulse Rate 63     Resp 20     Temp 98 F (36.7 C)     Temp Source Oral     SpO2 96 %     Weight      Height      Head Circumference      Peak Flow      Pain Score      Pain Loc      Pain Edu?      Excl. in GC?     Constitutional: Alert, agitated-appearing at times. Eyes: Normal exam ENT   Head: Normocephalic and atraumatic.   Mouth/Throat: Mucous membranes are moist. Cardiovascular: Normal rate, regular rhythm. Respiratory: Normal respiratory effort without tachypnea nor retractions. Breath sounds are clear  Gastrointestinal: Soft and nontender. No distention.  No reaction to abdominal palpation. Musculoskeletal: Nontender with normal range of motion in all extremities. No lower extremity edema. Neurologic:  Patient is awake alert. Moves all extremities spontaneously. Opens eyes spontaneously. Patient is somewhat agitated which they state is the only change from baseline. Skin:   Skin is warm, dry and intact.  Psychiatric: Some agitation which is not normal for the patient per caregiver.  ____________________________________________   RADIOLOGY  IMPRESSION: Suspect mild right infrahilar and medial right lower lobe airspace disease, suspicious for infection or aspiration.  Probable dilated esophagus, as before. This could predispose the patient to aspiration.  ____________________________________________   INITIAL IMPRESSION / ASSESSMENT AND PLAN / ED COURSE  Pertinent labs & imaging results that were available during my care of the patient were reviewed by me and considered in my medical decision making (see chart for details).  Patient presents to the emergency department with 2-3 seizures over the past one week which is abnormal for the patient. He also states difficulty swallowing, with occasional cough/clearing her throat which is not typical for the patient. We will check labs, obtain a chest x-ray to rule out aspiration, obtain a urine sample to rule out urinary tract infection. Overall the patient appears well, no distress but she is agitated at times. Caregiver states patient has a history of agitation with attempted blood draws, etc. and will require multiple helpers during blood and urine draw.  Patient's labs are largely within normal limits including a normal white blood cell count. Urinalysis negative. Patient's chest x-ray shows infrahilar and lower lobe airspace disease suspicious for aspiration. We will start the patient on Augmentin. I discussed the patient with Dr. Malvin Johns he states he does not recommend changing the patient antiepileptic medications at this time. He will follow-up in the office. Caregivers are agreeable to this plan. ____________________________________________   FINAL CLINICAL IMPRESSION(S) / ED DIAGNOSES  Seizure Agitation    Minna Antis, MD 04/23/17 1204

## 2017-04-24 LAB — URINE CULTURE: Culture: NO GROWTH

## 2017-04-29 DIAGNOSIS — R270 Ataxia, unspecified: Secondary | ICD-10-CM | POA: Insufficient documentation

## 2017-05-04 DIAGNOSIS — T17908A Unspecified foreign body in respiratory tract, part unspecified causing other injury, initial encounter: Secondary | ICD-10-CM | POA: Insufficient documentation

## 2017-06-01 ENCOUNTER — Ambulatory Visit: Payer: Medicare Other | Admitting: Gastroenterology

## 2017-08-10 ENCOUNTER — Encounter: Payer: Self-pay | Admitting: Emergency Medicine

## 2017-08-10 ENCOUNTER — Emergency Department: Payer: Medicare Other

## 2017-08-10 ENCOUNTER — Emergency Department
Admission: EM | Admit: 2017-08-10 | Discharge: 2017-08-10 | Disposition: A | Discharge status: Home / Self Care | Payer: Medicare Other | Attending: Emergency Medicine | Admitting: Emergency Medicine

## 2017-08-10 DIAGNOSIS — Q909 Down syndrome, unspecified: Secondary | ICD-10-CM | POA: Insufficient documentation

## 2017-08-10 DIAGNOSIS — Z79899 Other long term (current) drug therapy: Secondary | ICD-10-CM | POA: Insufficient documentation

## 2017-08-10 DIAGNOSIS — G308 Other Alzheimer's disease: Secondary | ICD-10-CM | POA: Insufficient documentation

## 2017-08-10 DIAGNOSIS — R569 Unspecified convulsions: Secondary | ICD-10-CM | POA: Insufficient documentation

## 2017-08-10 LAB — CBC
HCT: 37.7 % (ref 35.0–47.0)
Hemoglobin: 13.1 g/dL (ref 12.0–16.0)
MCH: 36 pg — ABNORMAL HIGH (ref 26.0–34.0)
MCHC: 34.8 g/dL (ref 32.0–36.0)
MCV: 103.4 fL — AB (ref 80.0–100.0)
PLATELETS: 245 10*3/uL (ref 150–440)
RBC: 3.64 MIL/uL — AB (ref 3.80–5.20)
RDW: 13.8 % (ref 11.5–14.5)
WBC: 4.7 10*3/uL (ref 3.6–11.0)

## 2017-08-10 LAB — URINALYSIS, ROUTINE W REFLEX MICROSCOPIC
Bilirubin Urine: NEGATIVE
GLUCOSE, UA: NEGATIVE mg/dL
Hgb urine dipstick: NEGATIVE
Ketones, ur: 5 mg/dL — AB
Leukocytes, UA: NEGATIVE
NITRITE: NEGATIVE
PH: 7 (ref 5.0–8.0)
PROTEIN: NEGATIVE mg/dL
SPECIFIC GRAVITY, URINE: 1.019 (ref 1.005–1.030)

## 2017-08-10 LAB — COMPREHENSIVE METABOLIC PANEL
ALBUMIN: 2.7 g/dL — AB (ref 3.5–5.0)
ALT: 17 U/L (ref 14–54)
AST: 33 U/L (ref 15–41)
Alkaline Phosphatase: 93 U/L (ref 38–126)
Anion gap: 6 (ref 5–15)
BUN: 17 mg/dL (ref 6–20)
CHLORIDE: 102 mmol/L (ref 101–111)
CO2: 31 mmol/L (ref 22–32)
CREATININE: 0.93 mg/dL (ref 0.44–1.00)
Calcium: 8.2 mg/dL — ABNORMAL LOW (ref 8.9–10.3)
GFR calc Af Amer: >60 mL/min (ref >60)
Glucose, Bld: 97 mg/dL (ref 65–99)
POTASSIUM: 4.1 mmol/L (ref 3.5–5.1)
SODIUM: 139 mmol/L (ref 135–145)
Total Bilirubin: 0.3 mg/dL (ref 0.3–1.2)
Total Protein: 6.3 g/dL — ABNORMAL LOW (ref 6.5–8.1)

## 2017-08-10 MED ORDER — LORAZEPAM 1 MG PO TABS
ORAL_TABLET | ORAL | Status: AC
  Administered 2017-08-10: 1 mg via ORAL
  Filled 2017-08-10: qty 1

## 2017-08-10 MED ORDER — LORAZEPAM 1 MG PO TABS
1.0000 mg | ORAL_TABLET | Freq: Once | ORAL | Status: AC
  Administered 2017-08-10: 1 mg via ORAL

## 2017-08-10 MED ORDER — LORAZEPAM 0.5 MG PO TABS: 0.5000 mg | ORAL_TABLET | ORAL | 0 refills | Status: AC | PRN

## 2017-08-10 NOTE — ED Triage Notes (Signed)
Patient to ER from facility [facility]) for c/o 4 seizures today. Has not had Ativan (has orders) due to being unable to get in touch with nurse to approve dose. Staff state patient's last seizure was less than 2 minutes long. Has h/o seizures for which she sees Dr. Malvin Johns. Patient is mentally handicapped and is poor historian. Patient currently taking Vimpat, Depakote, and Keppra.

## 2017-08-10 NOTE — ED Provider Notes (Signed)
[facility] Emergency Department Provider Note  Time seen: 8:36 PM  I have reviewed the triage vital signs and the nursing notes.   HISTORY  Chief Complaint Seizures    HPI Hannah Gardner is a 51 y.o. female With a past medical history of anxiety, mental retardation, Down's syndrome, depression, dementia, presents to the emergency department after a seizure. Patient's last seizure was several weeks ago. Today the patient had 4 seizures. Patient is prescribed Ativan to be given after each seizure, the group home state this was not instructed clearly on the prescription so they could not give it tonight. Patient had a first seizure on 8:00 this morning and her last seizure proximal mean hour prior to arrival. Sister is here with the patient, who states the patient is acting normal, at her baseline. She is currently calm and cooperative.  Past Medical History:  Diagnosis Date  . Abnormal uterine bleeding (AUB)   . Anxiety   . Cleft lip   . Complication of anesthesia    pt was given 2 shots for sedation for a past surgery that made pt aggressive.  Mom went back with pt for last surgery, pt did well.  . Dementia   . Depression   . Down's syndrome   . Hypsarrhythmia (HCC)   . Irregular periods/menstrual cycles   . Seizures (HCC)    last episode 07/19/16.  2-3x/mo    Patient Active Problem List   Diagnosis Date Noted  . Aspiration into airway 05/04/2017  . Ataxia 04/29/2017  . Mild protein-calorie malnutrition (HCC) 01/14/2017  . Mass of upper outer quadrant of right breast 11/19/2016  . Difficulty in walking 11/23/2014  . Seizure (HCC) 11/23/2014  . Disordered sleep 11/23/2014  . 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 AD (Alzheimer's disease) 04/05/2014  . 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 CERUMEN REMOVAL Bilateral 04/18/2015   Procedure: CERUMEN REMOVAL, Bilateral ;  Surgeon:  Geanie Logan, MD;  Location: [facility] OR[facility];  Service: ENT;  Laterality: Bilateral;  . CERUMEN REMOVAL Bilateral 08/05/2016   Procedure: CERUMEN REMOVAL;  Surgeon: Geanie Logan, MD;  Location[facility];  Service: ENT;  Laterality: Bilateral;  . EAR EXAMINATION UNDER ANESTHESIA Bilateral    remove cerumen from ears  . TOOTH EXTRACTION      Prior to Admission medications   Medication Sig Start Date End Date Taking? Authorizing Provider  acetaminophen (TYLENOL) 325 MG tablet Take 650 mg by mouth every 4 (four) hours as needed for mild pain or fever.     [provider]  aluminum-magnesium hydroxide-simethicone (MAALOX) 200-200-20 MG/5ML SUSP Take 30 mLs by mouth every 4 (four) hours as needed (upset stomach).    [provider]  ammonium lactate (AMLACTIN) 12 % cream Apply topically 2 (two) times daily as needed for dry skin.     [provider]  bacitracin-polymyxin b (POLYSPORIN) ophthalmic ointment  05/09/17   [provider]  calamine lotion Apply 1 application topically as needed for itching.    [provider]  Calcium Carbonate-Vitamin D (CALCIUM-VITAMIN D) 500-200 MG-UNIT per tablet Take 1 tablet by mouth 2 (two) times daily.    [provider]  diphenhydrAMINE (BENADRYL) 25 MG tablet Take 12.5-25 mg by mouth as needed for allergies. Every 4 to 6 hours PRN    [provider]  divalproex (DEPAKOTE SPRINKLE) 125 MG capsule  05/23/17   [provider]  divalproex (DEPAKOTE) 250 MG DR tablet TAKE 1 TABLET BY MOUTH TWICE DAILY FOR MOOD 08/27/16   [provider]  econazole nitrate 1 % cream Apply 1 application topically 2 (two) times daily. To feet/ toes for one month    [provider]  escitalopram (LEXAPRO) 10 MG tablet Take 10 mg by mouth 2 (two) times daily.    [provider]  hydrocerin (EUCERIN) CREA Apply 1 application topically at bedtime.    [provider]  hydrocortisone  2.5 % cream Apply topically 2 (two) times daily.    [provider]  hydrogen peroxide 1.5 % SOLN Place 4 drops into both ears See admin instructions. 2 times a month    [provider]  levETIRAcetam (KEPPRA) 500 MG tablet Take 3 tablets (1,500 mg total) by mouth 2 (two) times daily. 05/09/16   Emily Filbert, MD  levETIRAcetam (KEPPRA) 750 MG tablet  05/23/17   [provider]  loperamide (IMODIUM) 2 MG capsule Take 2-4 mg by mouth as needed for diarrhea or loose stools.     [provider]  LOPERAMIDE HCL PO Take by mouth.    [provider]  loratadine (CLARITIN) 10 MG tablet Take by mouth. 04/29/17 04/29/18  [provider]  LORazepam (ATIVAN) 0.5 MG tablet Take 0.5 mg by mouth as needed. For agitation; may repeat in 20 minutes with RN approval    [provider]  LORazepam (ATIVAN) 0.5 MG tablet Take 0.5 mg by mouth every 6 (six) hours as needed. Prior to family transport or on home visits    [provider]  LORazepam (ATIVAN) 1 MG tablet Take 1 mg by mouth as needed. 1.5-2 hours prior to invasive diagnostic testing/MD visit or 30-60 minutes prior to nail care    [provider]  magnesium hydroxide (MILK OF MAGNESIA) 400 MG/5ML suspension Take 30 mLs by mouth every three (3) days as needed for mild constipation or moderate constipation.     [provider]  medroxyPROGESTERone (PROVERA) 10 MG tablet Take by mouth.    [provider]  memantine (NAMENDA XR) 28 MG CP24 24 hr capsule Take 28 mg by mouth at bedtime.    [provider]  Multiple Vitamin (MULTIVITAMIN WITH MINERALS) TABS tablet Take 1 tablet by mouth daily.    [provider]  polymixin-bacitracin (POLYSPORIN) 500-10000 UNIT/GM OINT ointment Apply 1 application topically as needed. For minor abrasions, scratches, or excoriated areas    [provider]  Pseudoeph-Chlorphen-DM [facility] COUGH/COLD) 15-1-5  MG/5ML LIQD Take 10 mLs by mouth every 6 (six) hours as needed. For cold symptoms    [provider]  pseudoephedrine (SUDAFED) 120 MG 12 hr tablet Take by mouth. 12/10/16   [provider]  rivastigmine (EXELON) 9.5 mg/24hr Place 9.5 mg onto the skin daily.    [provider]  senna (SENOKOT) 8.6 MG tablet Take by mouth.    [provider]  SUNSCREEN SPF30 EX Apply topically as needed. Prior to sun exposure    [provider]  Talc (BABY POWDER EX) Apply 1 application topically daily as needed (for moisture protection).    [provider]  traZODone (DESYREL) 50 MG tablet Take 50 mg by mouth at bedtime as needed for sleep. Between 12 am-2 am    [provider]  triamcinolone ointment (KENALOG) 0.1 % Apply 1 application topically 2 (two) times daily as needed (for red, itchy skin).     [provider]  VIMPAT 150 MG TABS  10/23/16   [provider]    Allergies  Allergen Reactions  . Lamotrigine Rash    Rash on torso and arms    Family History  Problem Relation Age of Onset  . Cancer Neg Hx   . Diabetes Neg Hx   . Heart disease Neg Hx     Social History Social History  Substance Use Topics  . Smoking status: Never Smoker  . Smokeless tobacco: Never Used  . Alcohol use No    Review of Systems unable to obtain an adequate/accurate review of systems due to mental retardation.  ____________________________________________   PHYSICAL EXAM:  VITAL SIGNS: ED Triage Vitals  Enc Vitals Group     BP 08/10/17 1954 97/60     Pulse Rate 08/10/17 1954 (!) 51     Resp 08/10/17 1954 20     Temp 08/10/17 1954 (!) 97.5 F (36.4 C)     Temp Source 08/10/17 1954 Axillary     SpO2 08/10/17 1954 98 %     Weight 08/10/17 2000 97 lb (44 kg)     Height 08/10/17 2000  (1.372 m)     Head Circumference --      Peak Flow --      Pain Score --      Pain Loc --      Pain Edu? --      Excl. in GC? --      Constitutional: alert, no distress, acting at baseline per family. Eyes: Normal exam ENT   Head: Normocephalic and atraumatic.   Mouth/Throat: Mucous membranes are moist.grinding teeth which is chronic. Cardiovascular: Normal rate, regular rhythm. No murmur Respiratory: Normal respiratory effort without tachypnea nor retractions. Breath sounds are clear Gastrointestinal: Soft and nontender. No distention.   Musculoskeletal: Nontender with normal range of motion in all extremities.  Neurologic:  moves all extremities, no gross deficits. Skin:  Skin is warm, dry and intact.  Psychiatric: acting at baseline per family.  ____________________________________________   INITIAL IMPRESSION / ASSESSMENT AND PLAN / ED COURSE  Pertinent labs & imaging results that were available during my care of the patient were reviewed by me and considered in my medical decision making (see chart for details).  patient with a history of seizure disorder presents to the emergency department multiple seizures today. Family now states the patient is acting at her baseline, no distress at this time. We will allow the oral Ativan to help sedate the patient and calm her. We will attempt to check labs including urinalysis. Sister states she would like to take her home and have her follow-up with neurology if the workup is normal, which I believe is a safe plan of care.  patient's workup is negative. Negative for urinalysis and labs are largely within normal limits. We will discharge home with a prescription for Ativan to be used for any further seizure and have the patient follow-up with their neurologist tomorrow. Dr. Malvin Johns.  ____________________________________________   FINAL CLINICAL IMPRESSION(S) / ED DIAGNOSES  seizure    Minna Antis, MD 08/10/17 2312

## 2017-08-10 NOTE — ED Notes (Signed)
Pt continues to pull off any wires, unable to place pt on monitor at this time.

## 2017-08-10 NOTE — ED Notes (Signed)
Patient placed in room 4 with sister who is legal guardian and care giver from facility. Seizure pads and floor pads placed on and around stretcher. High fall risk bracelet also placed on patient.

## 2017-08-10 NOTE — ED Notes (Signed)
Attempted to go in to get blood work and in/out cath pt for urine.  Pt's sister and healthcare POA asks that we give her a little bit longer until she falls asleep.  ED MD notified.

## 2017-10-14 ENCOUNTER — Emergency Department
Admission: EM | Admit: 2017-10-14 | Discharge: 2017-10-14 | Disposition: A | Discharge status: Home / Self Care | Payer: Medicare Other | Attending: Emergency Medicine | Admitting: Emergency Medicine

## 2017-10-14 ENCOUNTER — Encounter: Payer: Self-pay | Admitting: Emergency Medicine

## 2017-10-14 DIAGNOSIS — G308 Other Alzheimer's disease: Secondary | ICD-10-CM | POA: Diagnosis not present

## 2017-10-14 DIAGNOSIS — Y939 Activity, unspecified: Secondary | ICD-10-CM | POA: Insufficient documentation

## 2017-10-14 DIAGNOSIS — S0993XA Unspecified injury of face, initial encounter: Secondary | ICD-10-CM | POA: Diagnosis present

## 2017-10-14 DIAGNOSIS — Y929 Unspecified place or not applicable: Secondary | ICD-10-CM | POA: Insufficient documentation

## 2017-10-14 DIAGNOSIS — Y999 Unspecified external cause status: Secondary | ICD-10-CM | POA: Diagnosis not present

## 2017-10-14 DIAGNOSIS — X58XXXA Exposure to other specified factors, initial encounter: Secondary | ICD-10-CM | POA: Diagnosis not present

## 2017-10-14 DIAGNOSIS — S0083XA Contusion of other part of head, initial encounter: Secondary | ICD-10-CM | POA: Insufficient documentation

## 2017-10-14 DIAGNOSIS — Z79899 Other long term (current) drug therapy: Secondary | ICD-10-CM | POA: Diagnosis not present

## 2017-10-14 DIAGNOSIS — F0280 Dementia in other diseases classified elsewhere without behavioral disturbance: Secondary | ICD-10-CM | POA: Insufficient documentation

## 2017-10-14 NOTE — ED Provider Notes (Signed)
[facility] Emergency Department Provider Note  ____________________________________________   First MD Initiated Contact with Patient 10/14/17 1747     (approximate)  I have reviewed the triage vital signs and the nursing notes.   HISTORY  Chief Complaint Head Injury    HPI Hannah Gardner is a 51 y.o. female who is a resident at [facility], her guardian and caregiver are present, they're concerned because she has a hematoma on her forehead, and they're not sure how it happened because it was unwitnessed, they do state that she is acting normal as usual, she has not had any vomiting, she does not act as if she has any pain   Past Medical History:  Diagnosis Date  . Abnormal uterine bleeding (AUB)   . Anxiety   . Cleft lip   . Complication of anesthesia    pt was given 2 shots for sedation for a past surgery that made pt aggressive.  Mom went back with pt for last surgery, pt did well.  . Dementia   . Depression   . Down's syndrome   . Hypsarrhythmia (HCC)   . Irregular periods/menstrual cycles   . Seizures (HCC)    last episode 07/19/16.  2-3x/mo    Patient Active Problem List   Diagnosis Date Noted  . Aspiration into airway 05/04/2017  . Ataxia 04/29/2017  . Mild protein-calorie malnutrition (HCC) 01/14/2017  . Mass of upper outer quadrant of right breast 11/19/2016  . Difficulty in walking 11/23/2014  . Seizure (HCC) 11/23/2014  . Disordered sleep 11/23/2014  . 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 AD (Alzheimer's disease) 04/05/2014  . 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 CERUMEN REMOVAL Bilateral 04/18/2015   Procedure: CERUMEN REMOVAL, Bilateral ;  Surgeon: Geanie LoganPaul Bennett, MD;  Location: [facility] ORS;  Service: ENT;  Laterality: Bilateral;  . CERUMEN REMOVAL Bilateral 08/05/2016   Procedure: CERUMEN REMOVAL;  Surgeon: Geanie LoganPaul Bennett, MD;  [facility];  Service: ENT;  Laterality: Bilateral;  . EAR EXAMINATION UNDER ANESTHESIA Bilateral    remove cerumen from ears  . TOOTH EXTRACTION      Prior to Admission medications   Medication Sig Start Date End Date Taking? Authorizing Provider  acetaminophen (TYLENOL) 325 MG tablet Take 650 mg by mouth every 4 (four) hours as needed for mild pain or fever.     [provider]  aluminum-magnesium hydroxide-simethicone (MAALOX) 200-200-20 MG/5ML SUSP Take 30 mLs by mouth every 4 (four) hours as needed (upset stomach).    [provider]  ammonium lactate (AMLACTIN) 12 % cream Apply topically 2 (two) times daily as needed for dry skin.     [provider]  calamine lotion Apply 1 application topically as needed for itching.    [provider]  Calcium Carbonate-Vitamin D (CALCIUM-VITAMIN D) 500-200 MG-UNIT per tablet Take 1 tablet by mouth 2 (two) times daily.    [provider]  diphenhydrAMINE (BENADRYL) 25 MG tablet Take 12.5-25 mg by mouth as needed for allergies. Every 4 to 6 hours PRN    [provider]  divalproex (DEPAKOTE SPRINKLE) 125 MG capsule  05/23/17   [provider]  escitalopram (LEXAPRO) 10 MG tablet Take 10 mg by mouth 2 (two) times daily.    [provider]  hydrocerin (EUCERIN) CREA Apply 1 application topically at bedtime.    [provider]  hydrocortisone 2.5 % cream  Apply topically 2 (two) times daily.    [provider]  hydrogen peroxide 1.5 % SOLN Place 4 drops into both ears See admin instructions. 2 times a month    [provider]  levETIRAcetam (KEPPRA) 500 MG tablet Take 3 tablets (1,500 mg total) by mouth 2 (two) times daily. 05/09/16   Emily Filbert, MD  levETIRAcetam (KEPPRA) 750 MG tablet  05/23/17   [provider]  loperamide (IMODIUM) 2 MG capsule Take 2-4 mg by mouth as needed for diarrhea or loose stools.     [provider]    LOPERAMIDE HCL PO Take by mouth.    [provider]  loratadine (CLARITIN) 10 MG tablet Take by mouth. 04/29/17 04/29/18  [provider]  LORazepam (ATIVAN) 0.5 MG tablet Take 0.5 mg by mouth as needed. For agitation; may repeat in 20 minutes with RN approval    [provider]  LORazepam (ATIVAN) 0.5 MG tablet Take 0.5 mg by mouth every 6 (six) hours as needed. Prior to family transport or on home visits    [provider]  LORazepam (ATIVAN) 0.5 MG tablet Take 1 tablet (0.5 mg total) by mouth as needed. take 1 tablet by mouth after a seizure, but only up to 3 times within a 24-hour period. 08/10/17 08/10/18  Minna Antis, MD  magnesium hydroxide (MILK OF MAGNESIA) 400 MG/5ML suspension Take 30 mLs by mouth every three (3) days as needed for mild constipation or moderate constipation.     [provider]  medroxyPROGESTERone (PROVERA) 10 MG tablet Take by mouth.    [provider]  memantine (NAMENDA XR) 28 MG CP24 24 hr capsule Take 28 mg by mouth at bedtime.    [provider]  Multiple Vitamin (MULTIVITAMIN WITH MINERALS) TABS tablet Take 1 tablet by mouth daily.    [provider]  polymixin-bacitracin (POLYSPORIN) 500-10000 UNIT/GM OINT ointment Apply 1 application topically as needed. For minor abrasions, scratches, or excoriated areas    [provider]  Pseudoeph-Chlorphen-DM [facility] COUGH/COLD) 15-1-5 MG/5ML LIQD Take 10 mLs by mouth every 6 (six) hours as needed. For cold symptoms    [provider]  pseudoephedrine (SUDAFED) 120 MG 12 hr tablet Take by mouth. 12/10/16   [provider]  rivastigmine (EXELON) 9.5 mg/24hr Place 9.5 mg onto the skin daily.    [provider]  senna (SENOKOT) 8.6 MG tablet Take by mouth.    [provider]  SUNSCREEN SPF30 EX Apply topically as needed. Prior to sun exposure    [provider]  Talc (BABY POWDER EX) Apply 1  application topically daily as needed (for moisture protection).    [provider]  traZODone (DESYREL) 50 MG tablet Take 50 mg by mouth at bedtime as needed for sleep. Between 12 am-2 am    [provider]  triamcinolone ointment (KENALOG) 0.1 % Apply 1 application topically 2 (two) times daily as needed (for red, itchy skin).     [provider]  VIMPAT 150 MG TABS  10/23/16   [provider]    Allergies Lamotrigine  Family History  Problem Relation Age of Onset  . Cancer Neg Hx   . Diabetes Neg Hx   . Heart disease Neg Hx     Social History Social History   Tobacco Use  . Smoking status: Never Smoker  . Smokeless tobacco: Never Used  Substance Use Topics  . Alcohol use: No  . Drug use: No  Review of Systems  Constitutional: No fever/chills HEAD: positive for  bruise on forehead Eyes: No visual changes. ENT: No sore throat. Respiratory: Denies cough Genitourinary: Negative for dysuria. Musculoskeletal: Negative for back pain. Skin: Negative for rash.    ____________________________________________   PHYSICAL EXAM:  VITAL SIGNS: ED Triage Vitals  Enc Vitals Group     BP 10/14/17 1740 99/66     Pulse Rate 10/14/17 1740 69     Resp 10/14/17 1740 16     Temp --      Temp src --      SpO2 10/14/17 1740 95 %     Weight 10/14/17 1738 96 lb (43.5 kg)     Height 10/14/17 1738 4\' 8"  (1.422 m)     Head Circumference --      Peak Flow --      Pain Score --      Pain Loc --      Pain Edu? --      Excl. in GC? --     Constitutional: Alert and oriented. Well appearing and in no acute distress. Patient is autistic and has difficulty communicating, her brother is present and is able to communicate for her Eyes: Conjunctivae are normal.  Head: Positive for a bruise on the right side of the forehead, minimal swelling is noted Cardiovascular: Normal rate, regular rhythm. Normal heart sounds Respiratory: Normal respiratory  effort.  No retractions, lungs are clear to auscultation GU: deferred Musculoskeletal: FROM all extremities, warm and well perfused Neurologic:  Normal speech and language.  Skin:  Skin is warm, dry and intact. No rash noted. Positive bruising on forehead Psychiatric: Mood and affect are at baseline for patient  ____________________________________________   LABS (all labs ordered are listed, but only abnormal results are displayed)  Labs Reviewed - No data to display ____________________________________________   ____________________________________________  RADIOLOGY    ____________________________________________   PROCEDURES  Procedure(s) performed: No      ____________________________________________   INITIAL IMPRESSION / ASSESSMENT AND PLAN / ED COURSE  Pertinent labs & imaging results that were available during my care of the patient were reviewed by me and considered in my medical decision making (see chart for details).  The patient is a 51 year old female with a history of autism, she is a resident at [facility], she has a contusion to the right forehead, after discussing options with the family,  they are agreeable to wait and watch for any changes in her behavior, we discussed head injury and signs of concussion versus head bleed, caregiver and guardian agreed that it would be too stressful on her to do a CT at this time, they will return to the emergency department if she is worsening or has any change in her behavior for further evaluation, she was discharged in their care and appeared to be in stable condition      ____________________________________________   FINAL CLINICAL IMPRESSION(S) / ED DIAGNOSES  Final diagnoses:  Facial contusion, initial encounter      NEW MEDICATIONS STARTED DURING THIS VISIT:  This SmartLink is deprecated. Use AVSMEDLIST instead to display the medication list for a patient.   Note:  This document was  prepared using Dragon voice recognition software and may include unintentional dictation errors.    Faythe GheeFisher, Natoria W, PA 10/14/17 Leeroy [id number]    Nita SickleVeronese, [place], MD 10/14/17 2001

## 2017-10-14 NOTE — Discharge Instructions (Signed)
Follow-up with her regular doctor is there are any problems, return to the emergency department if she is acting differently, if she has any vomiting, or if you have any concerns

## 2017-10-14 NOTE — ED Triage Notes (Addendum)
Pt to ED via POV with guardian and care giver, pt is nonverbal. Per family pt has hematoma noted to forehead with possible head injury. Unknown what happened. PT in NAD at this time, hx of dementia,  No use of blood thinners

## 2017-11-20 NOTE — Progress Notes (Signed)
Chief complaint: 1.  Annual gynecologic physical.   The patient is a 52 year old white female, para 0, with trisomy 8321,  who presents for gynecologic physical. There is no history of abnormal uterine bleeding over the past year.  She is no longer taking Provera. No history of STI.  No history of vaginal discharge or vaginal bleeding. She does wear depends.  Stools or clay colored.  No rectal bleeding is identified She has been seeing dermatology for perianal inflammation and bedsore.  Currently Desitin is being applied topically to the bed sore area on her lower back superior to the anus.  Mammogram- 12/30/2016 birad 1  Pap smear- 11/19/2016 neg  Colonoscopy- did not get one  Past Medical History:  Diagnosis Date  . Abnormal uterine bleeding (AUB)   . Anxiety   . Cleft lip   . Complication of anesthesia    pt was given 2 shots for sedation for a past surgery that made pt aggressive.  Mom went back with pt for last surgery, pt did well.  . Dementia   . Depression   . Down's syndrome   . Hypsarrhythmia (HCC)   . Irregular periods/menstrual cycles   . Seizures (HCC)    last episode 07/19/16.  2-3x/mo   Past Surgical History:  Procedure Laterality Date  . CERUMEN REMOVAL Bilateral 04/18/2015   Procedure: CERUMEN REMOVAL, Bilateral ;  Surgeon: Geanie LoganPaul Bennett, MD;  Location: [facility] ORS;  Service: ENT;  Laterality: Bilateral;  . CERUMEN REMOVAL Bilateral 08/05/2016   Procedure: CERUMEN REMOVAL;  Surgeon: Geanie LoganPaul Bennett, MD;  [facility];  Service: ENT;  Laterality: Bilateral;  . EAR EXAMINATION UNDER ANESTHESIA Bilateral    remove cerumen from ears  . TOOTH EXTRACTION      Review of systems: Not obtainable from patient. Ht 4\' 8"  (1.422 m)   LMP  (LMP Unknown)   BMI 21.52 kg/m   OBJECTIVE: lmp- no cycles Unable to sit still in wheelchair and on exam table.  Physical exam is somewhat limited. Neck: Nontender, no thyromegaly or lymphadenopathy. Lungs: Clear. Heart:  Regular rate and rhythm with 1/6 systolic murmur Breasts: No palpable masses, skin changes, adenopathy, or nipple discharge Abdomen: Soft, nontender, without organomegaly.  Voluntary guarding is present Pelvic:  External genitalia-normal.  BUS-normal.  Vagina-no discharge  Bimanual exam-unable to perform  Rectovaginal-chronic perianal skin changes without ulceration, areas of brawniness.  Small 5 mm healing skin ulcer superior to anus on back EXTREMITIES: Without edema; Clubbing present Skin: No rash, no ulceration  ASSESSMENT: 1.  Annual gynecologic exam  2.  Trisomy 21. 3.  Menopause 4.  Perianal inflammation/chronic skin changes and healing bedsore 0.5 cm  PLAN: 1.  No Pap smear 2.  Mammogram recommended 3.  Recommend applying Desitin to perianal inflammation and monitor closely for any evidence of cellulitis or ulceration 4.  Return in 1 year 5.  Keep dermatology follow-up to monitor bedsore on lower back and perianal skin changes  Darol Destinerystal Miller, CMA  Herold HarmsMartin A Defrancesco, MD  Note: This dictation was prepared with Dragon dictation along with smaller phrase technology. Any transcriptional errors that result from this process are unintentional.

## 2017-11-25 ENCOUNTER — Ambulatory Visit (INDEPENDENT_AMBULATORY_CARE_PROVIDER_SITE_OTHER): Payer: Medicare Other | Admitting: Obstetrics and Gynecology

## 2017-11-25 VITALS — Ht <= 58 in

## 2017-11-25 DIAGNOSIS — Z780 Asymptomatic menopausal state: Secondary | ICD-10-CM | POA: Insufficient documentation

## 2017-11-25 DIAGNOSIS — K6289 Other specified diseases of anus and rectum: Secondary | ICD-10-CM | POA: Diagnosis not present

## 2017-11-25 DIAGNOSIS — Q909 Down syndrome, unspecified: Secondary | ICD-10-CM

## 2017-11-25 DIAGNOSIS — Z01419 Encounter for gynecological examination (general) (routine) without abnormal findings: Secondary | ICD-10-CM

## 2017-11-25 NOTE — Patient Instructions (Addendum)
1.  No Pap smear is done today 2.  Yearly mammogram recommended 3.  Recommend Desitin to be applied topically to the perianal inflammation-chronic.  4.  Return in 1 year for physical   Health Maintenance for Postmenopausal Women Menopause is a normal process in which your reproductive ability comes to an end. This process happens gradually over a span of months to years, usually between the ages of 21 and 59. Menopause is complete when you have missed 12 consecutive menstrual periods. It is important to talk with your health care provider about some of the most common conditions that affect postmenopausal women, such as heart disease, cancer, and bone loss (osteoporosis). Adopting a healthy lifestyle and getting preventive care can help to promote your health and wellness. Those actions can also lower your chances of developing some of these common conditions. What should I know about menopause? During menopause, you may experience a number of symptoms, such as:  Moderate-to-severe hot flashes.  Night sweats.  Decrease in sex drive.  Mood swings.  Headaches.  Tiredness.  Irritability.  Memory problems.  Insomnia.  Choosing to treat or not to treat menopausal changes is an individual decision that you make with your health care provider. What should I know about hormone replacement therapy and supplements? Hormone therapy products are effective for treating symptoms that are associated with menopause, such as hot flashes and night sweats. Hormone replacement carries certain risks, especially as you become older. If you are thinking about using estrogen or estrogen with progestin treatments, discuss the benefits and risks with your health care provider. What should I know about heart disease and stroke? Heart disease, heart attack, and stroke become more likely as you age. This may be due, in part, to the hormonal changes that your body experiences during menopause. These can affect  how your body processes dietary fats, triglycerides, and cholesterol. Heart attack and stroke are both medical emergencies. There are many things that you can do to help prevent heart disease and stroke:  Have your blood pressure checked at least every 1-2 years. High blood pressure causes heart disease and increases the risk of stroke.  If you are 46-55 years old, ask your health care provider if you should take aspirin to prevent a heart attack or a stroke.  Do not use any tobacco products, including cigarettes, chewing tobacco, or electronic cigarettes. If you need help quitting, ask your health care provider.  It is important to eat a healthy diet and maintain a healthy weight. ? Be sure to include plenty of vegetables, fruits, low-fat dairy products, and lean protein. ? Avoid eating foods that are high in solid fats, added sugars, or salt (sodium).  Get regular exercise. This is one of the most important things that you can do for your health. ? Try to exercise for at least 150 minutes each week. The type of exercise that you do should increase your heart rate and make you sweat. This is known as moderate-intensity exercise. ? Try to do strengthening exercises at least twice each week. Do these in addition to the moderate-intensity exercise.  Know your numbers.Ask your health care provider to check your cholesterol and your blood glucose. Continue to have your blood tested as directed by your health care provider.  What should I know about cancer screening? There are several types of cancer. Take the following steps to reduce your risk and to catch any cancer development as early as possible. Breast Cancer  Practice breast self-awareness. ?  This means understanding how your breasts normally appear and feel. ? It also means doing regular breast self-exams. Let your health care provider know about any changes, no matter how small.  If you are 62 or older, have a clinician do a breast  exam (clinical breast exam or CBE) every year. Depending on your age, family history, and medical history, it may be recommended that you also have a yearly breast X-ray (mammogram).  If you have a family history of breast cancer, talk with your health care provider about genetic screening.  If you are at high risk for breast cancer, talk with your health care provider about having an MRI and a mammogram every year.  Breast cancer (BRCA) gene test is recommended for women who have family members with BRCA-related cancers. Results of the assessment will determine the need for genetic counseling and BRCA1 and for BRCA2 testing. BRCA-related cancers include these types: ? Breast. This occurs in males or females. ? Ovarian. ? Tubal. This may also be called fallopian tube cancer. ? Cancer of the abdominal or pelvic lining (peritoneal cancer). ? Prostate. ? Pancreatic.  Cervical, Uterine, and Ovarian Cancer Your health care provider may recommend that you be screened regularly for cancer of the pelvic organs. These include your ovaries, uterus, and vagina. This screening involves a pelvic exam, which includes checking for microscopic changes to the surface of your cervix (Pap test).  For women ages 21-65, health care providers may recommend a pelvic exam and a Pap test every three years. For women ages 14-65, they may recommend the Pap test and pelvic exam, combined with testing for human papilloma virus (HPV), every five years. Some types of HPV increase your risk of cervical cancer. Testing for HPV may also be done on women of any age who have unclear Pap test results.  Other health care providers may not recommend any screening for nonpregnant women who are considered low risk for pelvic cancer and have no symptoms. Ask your health care provider if a screening pelvic exam is right for you.  If you have had past treatment for cervical cancer or a condition that could lead to cancer, you need Pap  tests and screening for cancer for at least 20 years after your treatment. If Pap tests have been discontinued for you, your risk factors (such as having a new sexual partner) need to be reassessed to determine if you should start having screenings again. Some women have medical problems that increase the chance of getting cervical cancer. In these cases, your health care provider may recommend that you have screening and Pap tests more often.  If you have a family history of uterine cancer or ovarian cancer, talk with your health care provider about genetic screening.  If you have vaginal bleeding after reaching menopause, tell your health care provider.  There are currently no reliable tests available to screen for ovarian cancer.  Lung Cancer Lung cancer screening is recommended for adults 63-76 years old who are at high risk for lung cancer because of a history of smoking. A yearly low-dose CT scan of the lungs is recommended if you:  Currently smoke.  Have a history of at least 30 pack-years of smoking and you currently smoke or have quit within the past 15 years. A pack-year is smoking an average of one pack of cigarettes per day for one year.  Yearly screening should:  Continue until it has been 15 years since you quit.  Stop if you develop  a health problem that would prevent you from having lung cancer treatment.  Colorectal Cancer  This type of cancer can be detected and can often be prevented.  Routine colorectal cancer screening usually begins at age 66 and continues through age 68.  If you have risk factors for colon cancer, your health care provider may recommend that you be screened at an earlier age.  If you have a family history of colorectal cancer, talk with your health care provider about genetic screening.  Your health care provider may also recommend using home test kits to check for hidden blood in your stool.  A small camera at the end of a tube can be used to  examine your colon directly (sigmoidoscopy or colonoscopy). This is done to check for the earliest forms of colorectal cancer.  Direct examination of the colon should be repeated every 5-10 years until age 53. However, if early forms of precancerous polyps or small growths are found or if you have a family history or genetic risk for colorectal cancer, you may need to be screened more often.  Skin Cancer  Check your skin from head to toe regularly.  Monitor any moles. Be sure to tell your health care provider: ? About any new moles or changes in moles, especially if there is a change in a mole's shape or color. ? If you have a mole that is larger than the size of a pencil eraser.  If any of your family members has a history of skin cancer, especially at a young age, talk with your health care provider about genetic screening.  Always use sunscreen. Apply sunscreen liberally and repeatedly throughout the day.  Whenever you are outside, protect yourself by wearing long sleeves, pants, a wide-brimmed hat, and sunglasses.  What should I know about osteoporosis? Osteoporosis is a condition in which bone destruction happens more quickly than new bone creation. After menopause, you may be at an increased risk for osteoporosis. To help prevent osteoporosis or the bone fractures that can happen because of osteoporosis, the following is recommended:  If you are 43-32 years old, get at least 1,000 mg of calcium and at least 600 mg of vitamin D per day.  If you are older than age 31 but younger than age 33, get at least 1,200 mg of calcium and at least 600 mg of vitamin D per day.  If you are older than age 65, get at least 1,200 mg of calcium and at least 800 mg of vitamin D per day.  Smoking and excessive alcohol intake increase the risk of osteoporosis. Eat foods that are rich in calcium and vitamin D, and do weight-bearing exercises several times each week as directed by your health care  provider. What should I know about how menopause affects my mental health? Depression may occur at any age, but it is more common as you become older. Common symptoms of depression include:  Low or sad mood.  Changes in sleep patterns.  Changes in appetite or eating patterns.  Feeling an overall lack of motivation or enjoyment of activities that you previously enjoyed.  Frequent crying spells.  Talk with your health care provider if you think that you are experiencing depression. What should I know about immunizations? It is important that you get and maintain your immunizations. These include:  Tetanus, diphtheria, and pertussis (Tdap) booster vaccine.  Influenza every year before the flu season begins.  Pneumonia vaccine.  Shingles vaccine.  Your health care provider may  also recommend other immunizations. This information is not intended to replace advice given to you by your health care provider. Make sure you discuss any questions you have with your health care provider. Document Released: 12/19/2005 Document Revised: 05/16/2016 Document Reviewed: 07/31/2015 Elsevier Interactive Patient Education  2018 [employer].

## 2017-12-09 ENCOUNTER — Other Ambulatory Visit: Payer: Self-pay | Admitting: Obstetrics and Gynecology

## 2017-12-09 DIAGNOSIS — Z1231 Encounter for screening mammogram for malignant neoplasm of breast: Secondary | ICD-10-CM

## 2017-12-31 ENCOUNTER — Ambulatory Visit
Admission: RE | Admit: 2017-12-31 | Discharge: 2017-12-31 | Disposition: A | Discharge status: Home / Self Care | Payer: Medicare Other | Source: Ambulatory Visit | Attending: Obstetrics and Gynecology | Admitting: Obstetrics and Gynecology

## 2017-12-31 DIAGNOSIS — Z1231 Encounter for screening mammogram for malignant neoplasm of breast: Secondary | ICD-10-CM | POA: Diagnosis not present

## 2018-01-29 ENCOUNTER — Other Ambulatory Visit: Payer: Self-pay | Admitting: Physician Assistant

## 2018-01-29 DIAGNOSIS — R1319 Other dysphagia: Secondary | ICD-10-CM

## 2018-01-29 DIAGNOSIS — Q909 Down syndrome, unspecified: Secondary | ICD-10-CM

## 2018-01-29 DIAGNOSIS — R634 Abnormal weight loss: Secondary | ICD-10-CM

## 2018-02-04 ENCOUNTER — Ambulatory Visit
Admission: RE | Admit: 2018-02-04 | Discharge: 2018-02-04 | Disposition: A | Discharge status: Home / Self Care | Payer: Medicare Other | Source: Ambulatory Visit | Attending: Physician Assistant | Admitting: Physician Assistant

## 2018-02-04 DIAGNOSIS — R1319 Other dysphagia: Secondary | ICD-10-CM

## 2018-02-04 DIAGNOSIS — Q909 Down syndrome, unspecified: Secondary | ICD-10-CM | POA: Diagnosis not present

## 2018-02-04 DIAGNOSIS — R634 Abnormal weight loss: Secondary | ICD-10-CM | POA: Diagnosis present

## 2018-02-04 DIAGNOSIS — F0390 Unspecified dementia without behavioral disturbance: Secondary | ICD-10-CM | POA: Diagnosis not present

## 2018-02-04 DIAGNOSIS — R531 Weakness: Secondary | ICD-10-CM | POA: Insufficient documentation

## 2018-02-04 DIAGNOSIS — R1310 Dysphagia, unspecified: Secondary | ICD-10-CM | POA: Insufficient documentation

## 2018-02-04 NOTE — Therapy (Signed)
[facility] DIAGNOSTIC RADIOLOGY [address] [place], [place], [address] Phone: [phone]   Fax:     Modified Barium Swallow  Patient Details  Name: Hannah Gardner MRN: [id number] Date of Birth: Mar 22, 1966 No data recorded  Encounter Date: 02/04/2018  End of Session - 02/04/18 1323    Visit Number  1    Number of Visits  1    Date for SLP Re-Evaluation  02/04/18    SLP Start Time  1215    SLP Stop Time   1314    SLP Time Calculation (min)  59 min    Activity Tolerance  Patient tolerated treatment well       Past Medical History:  Diagnosis Date  . Abnormal uterine bleeding (AUB)   . Anxiety   . Cleft lip   . Complication of anesthesia    pt was given 2 shots for sedation for a past surgery that made pt aggressive.  Mom went back with pt for last surgery, pt did well.  . Dementia   . Depression   . Down's syndrome   . Hypsarrhythmia (HCC)   . Irregular periods/menstrual cycles   . Seizures (HCC)    last episode 07/19/16.  2-3x/mo    Past Surgical History:  Procedure Laterality Date  . CERUMEN REMOVAL Bilateral 04/18/2015   Procedure: CERUMEN REMOVAL, Bilateral ;  Surgeon: Geanie Logan, MD;  Location: [facility] ORS;  Service: ENT;  Laterality: Bilateral;  . CERUMEN REMOVAL Bilateral 08/05/2016   Procedure: CERUMEN REMOVAL;  Surgeon: Geanie Logan, MD;  Location: [facility];  Service: ENT;  Laterality: Bilateral;  . EAR EXAMINATION UNDER ANESTHESIA Bilateral    remove cerumen from ears  . TOOTH EXTRACTION      There were no vitals filed for this visit.   Subjective: Patient behavior: (alertness, ability to follow instructions, etc.): Patient has Down's and dementia.  She does not follow directions, but cooperated very well with the procedure.  Chief complaint: Patient is on a modified diet (puree with nectar-thick liquids) and there are concerns for esophogeal dysphagia    Objective:  Radiological Procedure: A  videoflouroscopic evaluation of oral-preparatory, reflex initiation, and pharyngeal phases of the swallow was performed; as well as a screening of the upper esophageal phase.  I. POSTURE: Upright in MBS chair  II. VIEW: Lateral  III. COMPENSATORY STRATEGIES: N/A  IV. BOLUSES ADMINISTERED:   Thin Liquid: 2 teaspoon presentations, 2 cup rim given by SLP   Nectar-thick Liquid: 2 teaspoon presentations   Honey-thick Liquid: DNT   Puree: 2 teaspoon presentations   Mechanical Soft: 1/4 graham cracker in applesauce  V. RESULTS OF EVALUATION: A. ORAL PREPARATORY PHASE: (The lips, tongue, and velum are observed for strength and coordination)       **Overall Severity Rating: Moderate; disorganized and prolonged posterior transfer  B. SWALLOW INITIATION/REFLEX: (The reflex is normal if "triggered" by the time the bolus reached the base of the tongue)  **Overall Severity Rating: Mild-moderate; triggers while falling from the valleculae to the pyriform sinuses  C. PHARYNGEAL PHASE: (Pharyngeal function is normal if the bolus shows rapid, smooth, and continuous transit through the pharynx and there is no pharyngeal residue after the swallow)  **Overall Severity Rating: Mild; decreased pharyngeal pressure generation/decreased tongue base retraction with trace-to-mild vallecular residue  D. LARYNGEAL PENETRATION: (Material entering into the laryngeal inlet/vestibule but not aspirated) None  E. ASPIRATION: None  F. ESOPHAGEAL PHASE: (Screening of the upper esophagus): Because the patient is small,  we were able to visualize the esophagus and observed barium was stacked up to the mid-esophagus.  In addition, the silhouette of the barium column had a serrated appearance.    ASSESSMENT: This 52 year old woman; with Down's syndrome and dementia; is presenting with moderate oral dysphagia characterized by disorganized posterior transfer and mild pharyngeal dysphagia characterized by delayed pharyngeal  swallow initiation, decreased tongue base retraction, trace-to-mild residue in the valleculae.  The patient tested with thin liquids via teaspoon and cup rim (held by SLP).  There was no observed laryngeal penetration or aspiration with any consistencies tested (puree, nectar-thick liquid, thin liquid). The patient is not currently at significant risk for prandial aspiration, but is at risk for further deterioration of pharyngeal function as her dementia progresses.  A review of recent chest X-rays indicates the patient may have chronic aspiration occurring.  Based on this study, I would suspect that aspiration is post prandial vs. prandial aspiration.  Recommend upgrading liquids to thin and remaining on pureed foods.  The patient may benefit referral to home health speech therapy for a clinical swallow assessment in her usual setting for family/staff education and develop feeding guidelines to maximize oral intake and safety.  The patient would benefit from consult with dietician to help plan calorie dense foods/liquids.  Because the patient is small, we were able to visualize the esophagus and observed barium was stacked up to the mid-esophagus.  In addition, the silhouette of the barium column had a serrated appearance.  Recommend GI consult for management of esophageal dysphagia.  In the meantime, I will include a list of basic esophageal dysmotility strategies.  PLAN/RECOMMENDATIONS:   A. Diet: Puree with thin liquids   B. Swallowing Precautions: 1. Small, more frequent meals, 2. Small bites/sips, 3. Remain upright after eating (at least 1 1/2 hours), 4. Aspiration precautions, 4. Stringent oral care after meals, 5. Esophageal dysmotility recommendations   C. Recommended consultation to: dietician to aid developing calorie dense meal plans, GI consult to manage esophageal dysphagia   D. Therapy recommendations: home health speech therapy for a clinical swallow assessment in her usual setting for  family/staff education and develop feeding guidelines to maximize oral intake and safety.    E. Results and recommendations were discussed with the patient's brother (POA) and caregiver immediately following the study and the final report routed to the patient's PCP and neurologist.   Esophageal Dysmotility Recommendations  1. Eat small, frequent meals.  Snack and graze throughout the day. Avoid large and even medium size meals.  2. Never lie down after eating.  The food sitting in in the esophagus can regurgitate and end up in your lungs.  Give yourself 3 hours before lying down.  3. Be careful about taking pills.  Pills can spend a long time in your esophagus cause damage.  Swallow pills with a large amount of water (12 oz) and remain upright for 1 hour.  Try to get liquid forms of medication vs. Pills/tablets.  4. Eat sitting completely upright only.  Gravity can help empty the esophagus.  5. Avoid acidic foods.  The acidic foods can pool in your esophagus and make symptoms worse.  6. Stay active and exercise (not right after eating!).  Activity helps your lungs clear anything that you may have aspirated.  7. Maintain stringent oral care- brush your teeth after every meal.    Patient will benefit from skilled therapeutic intervention in order to improve the following deficits and impairments:   Weight loss -  Plan: DG OP Swallowing Func-Medicare/Speech Path, DG OP Swallowing Func-Medicare/Speech Path  Down's syndrome - Plan: DG OP Swallowing Func-Medicare/Speech Path, DG OP Swallowing Func-Medicare/Speech Path  Other dysphagia - Plan: DG OP Swallowing Func-Medicare/Speech Path, DG OP Swallowing Func-Medicare/Speech Path        Problem List Patient Active Problem List   Diagnosis Date Noted  . Rectal inflammation 11/25/2017  . Menopause 11/25/2017  . Aspiration into airway 05/04/2017  . Ataxia 04/29/2017  . Mild protein-calorie malnutrition (HCC) 01/14/2017  . Mass of  upper outer quadrant of right breast 11/19/2016  . Difficulty in walking 11/23/2014  . Seizure (HCC) 11/23/2014  . Disordered sleep 11/23/2014  . 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 AD (Alzheimer's disease) 04/05/2014  . 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 Trisomy 21 06/14/2012   Hannah Primrose, MS/CCC- SLP  Leandrew Koyanagi 02/04/2018, 1:24 PM  [facility] DIAGNOSTIC RADIOLOGY [address] [place], [place], [address] Phone: [phone]   Fax:     Name: Hannah Gardner MRN: [id number] Date of Birth: 09-16-1966

## 2018-02-09 ENCOUNTER — Encounter: Payer: Medicare Other | Attending: Physician Assistant | Admitting: Physician Assistant

## 2018-02-09 DIAGNOSIS — G40909 Epilepsy, unspecified, not intractable, without status epilepticus: Secondary | ICD-10-CM | POA: Diagnosis not present

## 2018-02-09 DIAGNOSIS — Q909 Down syndrome, unspecified: Secondary | ICD-10-CM | POA: Insufficient documentation

## 2018-02-09 DIAGNOSIS — F0280 Dementia in other diseases classified elsewhere without behavioral disturbance: Secondary | ICD-10-CM | POA: Insufficient documentation

## 2018-02-09 DIAGNOSIS — G309 Alzheimer's disease, unspecified: Secondary | ICD-10-CM | POA: Insufficient documentation

## 2018-02-09 DIAGNOSIS — F329 Major depressive disorder, single episode, unspecified: Secondary | ICD-10-CM | POA: Insufficient documentation

## 2018-02-09 DIAGNOSIS — Q369 Cleft lip, unilateral: Secondary | ICD-10-CM | POA: Diagnosis not present

## 2018-02-09 DIAGNOSIS — L22 Diaper dermatitis: Secondary | ICD-10-CM | POA: Insufficient documentation

## 2018-02-10 NOTE — Progress Notes (Signed)
Hannah Gardner, Carrin ([id number]) Visit Report for 02/09/2018 Abuse/Suicide Risk Screen Details Patient Name: Hannah Gardner, Katheline Date of Service: 02/09/2018 12:30 PM Medical Record Number: [id number] Patient Account Number: [id number] Date of Birth/Sex: 12/09/1965 (52 y.o. F) Treating RN: Curtis Sitesorthy, Joanna Primary Care Kailen Name: Einar CrowAnderson, Marshall Other Clinician: Referring Kalyani Maeda: Rory PercyMIEDEMA, JAYSON Treating Kaspar Albornoz/Extender: Linwood DibblesSTONE III, HOYT Weeks in Treatment: 0 Abuse/Suicide Risk Screen Items Answer ABUSE/SUICIDE RISK SCREEN: Has anyone close to you tried to hurt or harm you recentlyo No Do you feel uncomfortable with anyone in your familyo No Has anyone forced you do things that you didnot want to doo No Do you have any thoughts of harming yourselfo No Patient displays signs or symptoms of abuse and/or neglect. No Electronic Signature(s) Signed: 02/09/2018 3:48:45 PM By: Curtis Sitesorthy, Joanna Entered By: Curtis Sitesorthy, Joanna on 02/09/2018 12:58:01 Hannah Gardner, Shastina ([id number]) -------------------------------------------------------------------------------- Activities of Daily Living Details Patient Name: Hannah Gardner, Maytal Date of Service: 02/09/2018 12:30 PM Medical Record Number: [id number] Patient Account Number: [id number] Date of Birth/Sex: 12/09/1965 (52 y.o. F) Treating RN: Curtis Sitesorthy, Joanna Primary Care Enaya Howze: Einar CrowAnderson, Marshall Other Clinician: Referring Selmer Adduci: Rory PercyMIEDEMA, JAYSON Treating Baylee Campus/Extender: Linwood DibblesSTONE III, HOYT Weeks in Treatment: 0 Activities of Daily Living Items Answer Activities of Daily Living (Please select one for each item) Drive Automobile Not Able Take Medications Need Assistance Use Telephone Not Able Care for Appearance Not Able Use Toilet Need Assistance Bath / Shower Need Assistance Dress Self Not Able Feed Self Not Able Walk Not Able Get In / Out Bed Need Assistance Housework Not Able Prepare Meals Not Able Handle Money Not Able Shop for Self Not Able Electronic  Signature(s) Signed: 02/09/2018 3:48:45 PM By: Curtis Sitesorthy, Joanna Entered By: Curtis Sitesorthy, Joanna on 02/09/2018 12:58:44 Hannah Gardner, Raaga ([id number]) -------------------------------------------------------------------------------- Education Assessment Details Patient Name: Hannah Gardner, Yasenia Date of Service: 02/09/2018 12:30 PM Medical Record Number: [id number] Patient Account Number: [id number] Date of Birth/Sex: 12/09/1965 (52 y.o. F) Treating RN: Curtis Sitesorthy, Joanna Primary Care Ordean Fouts: Einar CrowAnderson, Marshall Other Clinician: Referring Ketan Renz: Rory PercyMIEDEMA, JAYSON Treating Ziona Wickens/Extender: Linwood DibblesSTONE III, HOYT Weeks in Treatment: 0 Primary Learner Assessed: Caregiver staff Reason Patient is not Primary Learner: mental status Learning Preferences/Education Level/Primary Language Learning Preference: Printed Material Highest Education Level: College or Above Preferred Language: English Cognitive Barrier Assessment/Beliefs Language Barrier: No Translator Needed: No Memory Deficit: No Emotional Barrier: No Cultural/Religious Beliefs Affecting Medical Care: No Physical Barrier Assessment Impaired Vision: No Impaired Hearing: No Decreased Hand dexterity: No Knowledge/Comprehension Assessment Knowledge Level: Medium Comprehension Level: Medium Ability to understand written Medium instructions: Ability to understand verbal Medium instructions: Motivation Assessment Anxiety Level: Calm Cooperation: Cooperative Education Importance: Acknowledges Need Interest in Health Problems: Asks Questions Perception: Coherent Willingness to Engage in Self- Medium Management Activities: Readiness to Engage in Self- Medium Management Activities: Electronic Signature(s) Signed: 02/09/2018 3:48:45 PM By: Curtis Sitesorthy, Joanna Entered By: Curtis Sitesorthy, Joanna on 02/09/2018 12:59:38 Hannah Gardner, Gursimran ([id number]) -------------------------------------------------------------------------------- Fall Risk Assessment Details Patient Name: Hannah Gardner,  Cherae Date of Service: 02/09/2018 12:30 PM Medical Record Number: [id number] Patient Account Number: [id number] Date of Birth/Sex: 12/09/1965 (52 y.o. F) Treating RN: Curtis Sitesorthy, Joanna Primary Care Nhyira Leano: Einar CrowAnderson, Marshall Other Clinician: Referring Shernell Saldierna: Rory PercyMIEDEMA, JAYSON Treating Vu Liebman/Extender: Linwood DibblesSTONE III, HOYT Weeks in Treatment: 0 Fall Risk Assessment Items Have you had 2 or more falls in the last 12 monthso 0 No Have you had any fall that resulted in injury in the last 12 monthso 0 No FALL RISK ASSESSMENT: History of falling - immediate or within 3 months 25 Yes Secondary diagnosis 0 No Ambulatory aid None/bed rest/wheelchair/nurse 0 Yes Crutches/cane/walker 0 No  Furniture 0 No IV Access/Saline Lock 0 No Gait/Training Normal/bed rest/immobile 0 No Weak 10 Yes Impaired 20 Yes Mental Status Oriented to own ability 0 No Electronic Signature(s) Signed: 02/09/2018 3:48:45 PM By: Curtis Sites Entered By: Curtis Sites on 02/09/2018 13:00:06 Hannah Hora ([id number]) -------------------------------------------------------------------------------- Nutrition Risk Assessment Details Patient Name: Hannah Hora Date of Service: 02/09/2018 12:30 PM Medical Record Number: [id number] Patient Account Number: [id number] Date of Birth/Sex: 1966/06/22 (52 y.o. F) Treating RN: Curtis Sites Primary Care Hector Taft: Einar Crow Other Clinician: Referring Satoya Feeley: Rory Percy Treating Iqra Rotundo/Extender: Linwood Dibbles, HOYT Weeks in Treatment: 0 Height (in): 56 Weight (lbs): 96 Body Mass Index (BMI): 21.5 Nutrition Risk Assessment Items NUTRITION RISK SCREEN: I have an illness or condition that made me change the kind and/or amount of 0 No food I eat I eat fewer than two meals per day 0 No I eat few fruits and vegetables, or milk products 0 No I have three or more drinks of beer, liquor or wine almost every day 0 No I have tooth or mouth problems that make it hard for me to eat 0  No I don't always have enough money to buy the food I need 0 No I eat alone most of the time 0 No I take three or more different prescribed or over-the-counter drugs a day 1 Yes Without wanting to, I have lost or gained 10 pounds in the last six months 0 No I am not always physically able to shop, cook and/or feed myself 0 No Nutrition Protocols Good Risk Protocol 0 No interventions needed Moderate Risk Protocol Electronic Signature(s) Signed: 02/09/2018 3:48:45 PM By: Curtis Sites Entered By: Curtis Sites on 02/09/2018 13:00:23

## 2018-02-12 NOTE — Progress Notes (Signed)
Hannah Gardner, Hannah Gardner ([id number]) Visit Report for 02/09/2018 Allergy List Details Patient Name: Hannah Gardner, Hannah Gardner Date of Service: 02/09/2018 12:30 PM Medical Record Number: [id number] Patient Account Number: [id number] Date of Birth/Sex: April 11, 1966 (52 y.o. F) Treating RN: Curtis Sitesorthy, Joanna Primary Care Blanca Carreon: Einar CrowAnderson, Marshall Other Clinician: Referring Arien Benincasa: Rory PercyMIEDEMA, JAYSON Treating Nysir Fergusson/Extender: STONE III, HOYT Weeks in Treatment: 0 Allergies Active Allergies lamotrigine Allergy Notes Electronic Signature(s) Signed: 02/09/2018 3:48:45 PM By: Curtis Sitesorthy, Joanna Entered By: Curtis Sitesorthy, Joanna on 02/09/2018 12:57:50 Hannah Gardner, Hannah Gardner ([id number]) -------------------------------------------------------------------------------- Arrival Information Details Patient Name: Hannah Gardner, Hannah Gardner Date of Service: 02/09/2018 12:30 PM Medical Record Number: [id number] Patient Account Number: [id number] Date of Birth/Sex: April 11, 1966 (52 y.o. F) Treating RN: Curtis Sitesorthy, Joanna Primary Care Natara Monfort: Einar CrowAnderson, Marshall Other Clinician: Referring Ibraheem Voris: Rory PercyMIEDEMA, JAYSON Treating Mahamadou Weltz/Extender: Linwood DibblesSTONE III, HOYT Weeks in Treatment: 0 Visit Information Patient Arrived: Wheel Chair Arrival Time: 12:52 Accompanied By: staff and aunt Transfer Assistance: Manual Patient Identification Verified: Yes Secondary Verification Process Completed: Yes Electronic Signature(s) Signed: 02/09/2018 3:48:45 PM By: Curtis Sitesorthy, Joanna Entered By: Curtis Sitesorthy, Joanna on 02/09/2018 12:53:13 Hannah Gardner, Hannah Gardner ([id number]) -------------------------------------------------------------------------------- Clinic Level of Care Assessment Details Patient Name: Hannah Gardner, Hannah Gardner Date of Service: 02/09/2018 12:30 PM Medical Record Number: [id number] Patient Account Number: [id number] Date of Birth/Sex: April 11, 1966 (52 y.o. F) Treating RN: Phillis HaggisPinkerton, Debi Primary Care Copelan Maultsby: Einar CrowAnderson, Marshall Other Clinician: Referring Qais Jowers: Rory PercyMIEDEMA, JAYSON Treating Alvita Fana/Extender: Linwood DibblesSTONE  III, HOYT Weeks in Treatment: 0 Clinic Level of Care Assessment Items TOOL 2 Quantity Score X - Use when only an EandM is performed on the INITIAL visit 1 0 ASSESSMENTS - Nursing Assessment / Reassessment X - General Physical Exam (combine w/ comprehensive assessment (listed just below) when 1 20 performed on new pt. evals) X- 1 25 Comprehensive Assessment (HX, ROS, Risk Assessments, Wounds Hx, etc.) ASSESSMENTS - Wound and Skin Assessment / Reassessment []  - Simple Wound Assessment / Reassessment - one wound 0 []  - 0 Complex Wound Assessment / Reassessment - multiple wounds []  - 0 Dermatologic / Skin Assessment (not related to wound area) ASSESSMENTS - Ostomy and/or Continence Assessment and Care []  - Incontinence Assessment and Management 0 []  - 0 Ostomy Care Assessment and Management (repouching, etc.) PROCESS - Coordination of Care X - Simple Patient / Family Education for ongoing care 1 15 []  - 0 Complex (extensive) Patient / Family Education for ongoing care []  - 0 Staff obtains ChiropractorConsents, Records, Test Results / Process Orders []  - 0 Staff telephones HHA, Nursing Homes / Clarify orders / etc []  - 0 Routine Transfer to another Facility (non-emergent condition) []  - 0 Routine Hospital Admission (non-emergent condition) []  - 0 New Admissions / Manufacturing engineernsurance Authorizations / Ordering NPWT, Apligraf, etc. []  - 0 Emergency Hospital Admission (emergent condition) X- 1 10 Simple Discharge Coordination []  - 0 Complex (extensive) Discharge Coordination PROCESS - Special Needs []  - Pediatric / Minor Patient Management 0 []  - 0 Isolation Patient Management Hannah Gardner, Hannah Gardner ([id number]) []  - 0 Hearing / Language / Visual special needs []  - 0 Assessment of Community assistance (transportation, D/C planning, etc.) []  - 0 Additional assistance / Altered mentation []  - 0 Support Surface(s) Assessment (bed, cushion, seat, etc.) INTERVENTIONS - Wound Cleansing / Measurement []  - Wound  Imaging (photographs - any number of wounds) 0 []  - 0 Wound Tracing (instead of photographs) []  - 0 Simple Wound Measurement - one wound []  - 0 Complex Wound Measurement - multiple wounds []  - 0 Simple Wound Cleansing - one wound []  - 0 Complex Wound Cleansing - multiple wounds INTERVENTIONS - Wound  Dressings []  - Small Wound Dressing one or multiple wounds 0 []  - 0 Medium Wound Dressing one or multiple wounds []  - 0 Large Wound Dressing one or multiple wounds []  - 0 Application of Medications - injection INTERVENTIONS - Miscellaneous []  - External ear exam 0 []  - 0 Specimen Collection (cultures, biopsies, blood, body fluids, etc.) []  - 0 Specimen(s) / Culture(s) sent or taken to Lab for analysis []  - 0 Patient Transfer (multiple staff / Michiel Sites Lift / Similar devices) []  - 0 Simple Staple / Suture removal (25 or less) []  - 0 Complex Staple / Suture removal (26 or more) []  - 0 Hypo / Hyperglycemic Management (close monitor of Blood Glucose) []  - 0 Ankle / Brachial Index (ABI) - do not check if billed separately Has the patient been seen at the hospital within the last three years: Yes Total Score: 70 Level Of Care: New/Established - Level 2 Electronic Signature(s) Signed: 02/11/2018 4:32:30 PM By: Alejandro Mulling Entered By: Alejandro Mulling on 02/09/2018 14:14:27 Hannah Gardner ([id number]) -------------------------------------------------------------------------------- Encounter Discharge Information Details Patient Name: Hannah Gardner Date of Service: 02/09/2018 12:30 PM Medical Record Number: [id number] Patient Account Number: [id number] Date of Birth/Sex: 1965/12/11 (52 y.o. F) Treating RN: Phillis Haggis Primary Care Loida Calamia: Einar Crow Other Clinician: Referring Kasaundra Fahrney: Rory Percy Treating Tamber Burtch/Extender: Linwood Dibbles, HOYT Weeks in Treatment: 0 Encounter Discharge Information Items Discharge Condition: Stable Ambulatory Status: Wheelchair Other  (Note Discharge Destination: Required) Transportation: Private Auto caregivers, Accompanied By: sister Schedule Follow-up Appointment: No Medication Reconciliation completed and No provided to Patient/Care Gene Colee: Provided on Clinical Summary of Care: 02/09/2018 Form Type Recipient Paper Patient [facility] Electronic Signature(s) Signed: 02/10/2018 3:25:45 PM By: Gwenlyn Perking Entered By: Gwenlyn Perking on 02/09/2018 13:36:09 Hannah Gardner ([id number]) -------------------------------------------------------------------------------- Multi Wound Chart Details Patient Name: Hannah Gardner Date of Service: 02/09/2018 12:30 PM Medical Record Number: [id number] Patient Account Number: [id number] Date of Birth/Sex: 02-27-1966 (52 y.o. F) Treating RN: Phillis Haggis Primary Care Litzy Dicker: Einar Crow Other Clinician: Referring Rupert Azzara: Rory Percy Treating Malyiah Fellows/Extender: Linwood Dibbles, HOYT Weeks in Treatment: 0 Vital Signs Height(in): 56 Pulse(bpm): 76 Weight(lbs): 96 Blood Pressure(mmHg): 102/58 Body Mass Index(BMI): 22 Temperature(F): 98.5 Respiratory Rate 16 (breaths/min): Wound Assessments Treatment Notes Electronic Signature(s) Signed: 02/11/2018 4:32:30 PM By: Alejandro Mulling Entered By: Alejandro Mulling on 02/09/2018 13:21:20 Hannah Gardner ([id number]) -------------------------------------------------------------------------------- Multi-Disciplinary Care Plan Details Patient Name: Hannah Gardner Date of Service: 02/09/2018 12:30 PM Medical Record Number: [id number] Patient Account Number: [id number] Date of Birth/Sex: August 01, 1966 (52 y.o. F) Treating RN: Phillis Haggis Primary Care Caterra Ostroff: Einar Crow Other Clinician: Referring Bertil Brickey: Rory Percy Treating Shavone Nevers/Extender: Linwood Dibbles, HOYT Weeks in Treatment: 0 Active Inactive Electronic Signature(s) Signed: 02/11/2018 4:32:30 PM By: Alejandro Mulling Entered By: Alejandro Mulling on 02/09/2018 13:21:06 Hannah Gardner ([id number]) -------------------------------------------------------------------------------- Pain Assessment Details Patient Name: Hannah Gardner Date of Service: 02/09/2018 12:30 PM Medical Record Number: [id number] Patient Account Number: [id number] Date of Birth/Sex: 02/22/1966 (52 y.o. F) Treating RN: Curtis Sites Primary Care Adynn Caseres: Einar Crow Other Clinician: Referring Shawntez Dickison: Rory Percy Treating Savahna Casados/Extender: Linwood Dibbles, HOYT Weeks in Treatment: 0 Active Problems Location of Pain Severity and Description of Pain Patient Has Paino Patient Unable to Respond Site Locations Pain Management and Medication Current Pain Management: Electronic Signature(s) Signed: 02/09/2018 3:48:45 PM By: Curtis Sites Entered By: Curtis Sites on 02/09/2018 12:53:32 Hannah Gardner ([id number]) -------------------------------------------------------------------------------- Patient/Caregiver Education Details Patient Name: Hannah Gardner Date of Service: 02/09/2018 12:30 PM Medical Record Number: [id number] Patient Account Number: [id number] Date of Birth/Gender: January 12, 1966 (52 y.o. F)  Treating RN: Phillis Haggis Primary Care Physician: Einar Crow Other Clinician: Referring Physician: Rory Percy Treating Physician/Extender: Skeet Simmer in Treatment: 0 Education Assessment Education Provided To: Patient and Caregiver Education Topics Provided Wound/Skin Impairment: Handouts: Other: Keep area clean and dry. Keep zinc oxide on area for protection. Methods: Explain/Verbal Responses: State content correctly Electronic Signature(s) Signed: 02/11/2018 4:32:30 PM By: Alejandro Mulling Entered By: Alejandro Mulling on 02/09/2018 13:27:17 Hannah Gardner ([id number]) -------------------------------------------------------------------------------- Vitals Details Patient Name: Hannah Gardner Date of Service: 02/09/2018 12:30 PM Medical Record Number: [id number] Patient  Account Number: [id number] Date of Birth/Sex: Apr 03, 1966 (52 y.o. F) Treating RN: Curtis Sites Primary Care Yvette Roark: Einar Crow Other Clinician: Referring Carra Brindley: Rory Percy Treating Usbaldo Pannone/Extender: Linwood Dibbles, HOYT Weeks in Treatment: 0 Vital Signs Time Taken: 12:54 Temperature (F): 98.5 Height (in): 56 Pulse (bpm): 76 Source: Measured Respiratory Rate (breaths/min): 16 Weight (lbs): 96 Blood Pressure (mmHg): 102/58 Source: Measured Reference Range: 80 - 120 mg / dl Body Mass Index (BMI): 21.5 Electronic Signature(s) Signed: 02/09/2018 3:48:45 PM By: Curtis Sites Entered By: Curtis Sites on 02/09/2018 12:57:31

## 2018-02-12 NOTE — Progress Notes (Signed)
Hannah, Gardner ([id number]) Visit Report for 02/09/2018 Chief Complaint Document Details Patient Name: Hannah Gardner Date of Service: 02/09/2018 12:30 PM Medical Record Number: [id number] Patient Account Number: [id number] Date of Birth/Sex: [birth year] (52 y.o. F) Treating RN: Phillis Haggis Primary Care Provider: Einar Crow Other Clinician: Referring Provider: Rory Percy Treating Provider/Extender: Linwood Dibbles, HOYT Weeks in Treatment: 0 Information Obtained from: Patient Chief Complaint Diaper Dermititis Electronic Signature(s) Signed: 02/09/2018 5:42:34 PM By: Lenda Kelp PA-C Entered By: Lenda Kelp on 02/09/2018 17:37:53 [place], [place] ([id number]) -------------------------------------------------------------------------------- HPI Details Patient Name: Hannah Gardner Date of Service: 02/09/2018 12:30 PM Medical Record Number: [id number] Patient Account Number: [id number] Date of Birth/Sex: 03-20-1966 (52 y.o. F) Treating RN: Phillis Haggis Primary Care Provider: Einar Crow Other Clinician: Referring Provider: Rory Percy Treating Provider/Extender: Linwood Dibbles, HOYT Weeks in Treatment: 0 History of Present Illness HPI Description: 02/09/18 patient presents today for initial evaluation and our clinic concerning an ulceration on her gluteal region which according to notes review today from [facility] dermatology appears to be due to diaper dermatitis. With that being said upon presentation today the patient does not appear to have any open ulcerations at this point. Obviously this is good news. With that being said the current regimen has been that every two hours including at night the patient is changed as far as the brief is concerned and Desitin is applied. This is whether she appears to be wet or not. This has seem to be of benefit for her. She is in a group home at this point in time secondary to Down syndrome. Her sister is present during the office visit today. Currently  it does appear that the facility has been doing an excellent job caring for her at this point. She appears to be very clean and dry during evaluation today and there does not appear to be any opening whatsoever noted during my valuation. Everything at this point seems to be closed which is good news she also does not seem to have any pain although obviously secondary to the down syndrome and mental status she was unable to really describe or rate any discomfort. Nonetheless the patient seems to be tolerating the the Desitin very well despite the fact when she is up she does tend to be very active squirming and fidgeting this seems to have prevented any further breakdown. Otherwise there is no evidence of pressure injury at this point. Electronic Signature(s) Signed: 02/09/2018 5:42:34 PM By: Lenda Kelp PA-C Entered By: Lenda Kelp on 02/09/2018 17:39:04 Hannah Gardner ([id number]) -------------------------------------------------------------------------------- Physical Exam Details Patient Name: Hannah Gardner Date of Service: 02/09/2018 12:30 PM Medical Record Number: [id number] Patient Account Number: [id number] Date of Birth/Sex: 26-Mar-1966 (53 y.o. F) Treating RN: Phillis Haggis Primary Care Provider: Einar Crow Other Clinician: Referring Provider: Rory Percy Treating Provider/Extender: Linwood Dibbles, HOYT Weeks in Treatment: 0 Constitutional supine blood pressure is within target range for patient.. pulse regular and within target range for patient.Marland Kitchen respirations regular, non-labored and within target range for patient.Marland Kitchen temperature within target range for patient.. Well-nourished and well-hydrated in no acute distress. Eyes conjunctiva clear no eyelid edema noted. pupils equal round and reactive to light and accommodation. Ears, Nose, Mouth, and Throat no gross abnormality of ear auricles or external auditory canals. normal hearing noted during conversation. mucus membranes  moist. Respiratory normal breathing without difficulty. clear to auscultation bilaterally. Cardiovascular regular rate and rhythm with normal S1, S2. no clubbing, cyanosis, significant edema, <3 sec cap refill. Gastrointestinal (GI) soft, non-tender,  non-distended, +BS. no ventral hernia noted. Musculoskeletal Patient unable to walk without assistance. Psychiatric Patient is not able to cooperate in decision making regarding care. Patient has dementia. patient is confused. Notes Upon thorough inspection of patient's gluteal region there does not appear to be any evidence of skin breakdown although I do see an area of creasing where there appears that may have been a previous breakdown area. This is the region that her sister who was present during evaluation today as well tells me is prone to reopening and sometimes very quickly. Nonetheless right now I see no evidence of this being a problem. I think that considering the Desitin has done this well for her up to this point that this is likely going to be one of the best things to do and continue to do in order to see this heal and stay healed if it reopens. Electronic Signature(s) Signed: 02/09/2018 5:42:34 PM By: Lenda Kelp PA-C Entered By: Lenda Kelp on 02/09/2018 17:40:24 Hannah Gardner ([id number]) -------------------------------------------------------------------------------- Physician Orders Details Patient Name: Hannah Gardner Date of Service: 02/09/2018 12:30 PM Medical Record Number: [id number] Patient Account Number: [id number] Date of Birth/Sex: 09/23/1966 (52 y.o. F) Treating RN: Phillis Haggis Primary Care Provider: Einar Crow Other Clinician: Referring Provider: Rory Percy Treating Provider/Extender: Linwood Dibbles, HOYT Weeks in Treatment: 0 Verbal / Phone Orders: Yes Clinician: Pinkerton, Debi Read Back and Verified: Yes Diagnosis Coding Off-Loading o Turn and reposition every 2 hours Discharge From [facility]  Services o Discharge from Wound Care Center - Keep area clean and dry. Keep zinc oxide on area for protection. Please call our office if you have any questions or concerns. Electronic Signature(s) Signed: 02/09/2018 5:42:34 PM By: Lenda Kelp PA-C Signed: 02/11/2018 4:32:30 PM By: Alejandro Mulling Entered By: Alejandro Mulling on 02/09/2018 13:26:31 Hannah Gardner ([id number]) -------------------------------------------------------------------------------- Problem List Details Patient Name: Hannah Gardner Date of Service: 02/09/2018 12:30 PM Medical Record Number: [id number] Patient Account Number: [id number] Date of Birth/Sex: 09-09-1966 (52 y.o. F) Treating RN: Phillis Haggis Primary Care Provider: Einar Crow Other Clinician: Referring Provider: Rory Percy Treating Provider/Extender: Linwood Dibbles, HOYT Weeks in Treatment: 0 Active Problems ICD-10 Impacting Encounter Code Description Active Date Wound Healing Diagnosis L22 Diaper dermatitis 02/09/2018 Yes Q90.9 Down syndrome, unspecified 02/09/2018 Yes F03.91 Unspecified dementia with behavioral disturbance 02/09/2018 Yes Inactive Problems Resolved Problems Electronic Signature(s) Signed: 02/09/2018 5:42:34 PM By: Lenda Kelp PA-C Entered By: Lenda Kelp on 02/09/2018 17:37:02 Hannah Gardner ([id number]) -------------------------------------------------------------------------------- Progress Note Details Patient Name: Hannah Gardner Date of Service: 02/09/2018 12:30 PM Medical Record Number: [id number] Patient Account Number: [id number] Date of Birth/Sex: [birth year] (52 y.o. F) Treating RN: Phillis Haggis Primary Care Provider: Einar Crow Other Clinician: Referring Provider: Rory Percy Treating Provider/Extender: Linwood Dibbles, HOYT Weeks in Treatment: 0 Subjective Chief Complaint Information obtained from Patient Diaper Dermititis History of Present Illness (HPI) 02/09/18 patient presents today for initial evaluation  and our clinic concerning an ulceration on her gluteal region which according to notes review today from [facility] dermatology appears to be due to diaper dermatitis. With that being said upon presentation today the patient does not appear to have any open ulcerations at this point. Obviously this is good news. With that being said the current regimen has been that every two hours including at night the patient is changed as far as the brief is concerned and Desitin is applied. This is whether she appears to be wet or not. This has seem to be of benefit for her. She  is in a group home at this point in time secondary to Down syndrome. Her sister is present during the office visit today. Currently it does appear that the facility has been doing an excellent job caring for her at this point. She appears to be very clean and dry during evaluation today and there does not appear to be any opening whatsoever noted during my valuation. Everything at this point seems to be closed which is good news she also does not seem to have any pain although obviously secondary to the down syndrome and mental status she was unable to really describe or rate any discomfort. Nonetheless the patient seems to be tolerating the the Desitin very well despite the fact when she is up she does tend to be very active squirming and fidgeting this seems to have prevented any further breakdown. Otherwise there is no evidence of pressure injury at this point. Wound History Patient presents with 1 open wound that has been present for approximately 6 months. Patient has been treating wound in the following manner: desitin cream. Laboratory tests have not been performed in the last month. Patient reportedly has not tested positive for an antibiotic resistant organism. Patient reportedly has not tested positive for osteomyelitis. Patient reportedly has not had testing performed to evaluate circulation in the legs. Patient  History Information obtained from Caregiver. Allergies lamotrigine Family History Heart Disease - Father,Paternal Grandparents, Hypertension - Paternal Grandparents,Father, No family history of Cancer, Diabetes, Hereditary Spherocytosis, Kidney Disease, Lung Disease, Seizures, Stroke, Thyroid Problems, Tuberculosis. Social History Never smoker, Marital Status - Single, Alcohol Use - Never, Drug Use - No History. Medical History Hematologic/Lymphatic Denies history of Anemia, Hemophilia, Human Immunodeficiency Virus, Lymphedema, Sickle Cell Disease Respiratory Denies history of Aspiration, Asthma, Chronic Obstructive Pulmonary Disease (COPD), Pneumothorax, Sleep Apnea, Bellot, Bralee ([id number]) Tuberculosis Cardiovascular Denies history of Angina, Arrhythmia, Congestive Heart Failure, Coronary Artery Disease, Deep Vein Thrombosis, Hypertension, Hypotension, Myocardial Infarction, Peripheral Arterial Disease, Peripheral Venous Disease, Phlebitis, Vasculitis Gastrointestinal Denies history of Cirrhosis , Colitis, Crohn s, Hepatitis A, Hepatitis B, Hepatitis C Neurologic Patient has history of Dementia, Seizure Disorder Oncologic Denies history of Received Chemotherapy, Received Radiation Medical And Surgical History Notes Ear/Nose/Mouth/Throat cleft lip Respiratory seasonal allergies Neurologic downs syndrome, alzheimers Psychiatric depression Review of Systems (ROS) Constitutional Symptoms (General Health) The patient has no complaints or symptoms. Eyes The patient has no complaints or symptoms. Ear/Nose/Mouth/Throat The patient has no complaints or symptoms. Hematologic/Lymphatic The patient has no complaints or symptoms. Respiratory The patient has no complaints or symptoms. Cardiovascular The patient has no complaints or symptoms. Gastrointestinal The patient has no complaints or symptoms. Endocrine The patient has no complaints or  symptoms. Genitourinary Complains or has symptoms of Incontinence/dribbling. Immunological The patient has no complaints or symptoms. Integumentary (Skin) The patient has no complaints or symptoms. Musculoskeletal The patient has no complaints or symptoms. Oncologic The patient has no complaints or symptoms. Objective Simonetti, Willadean ([id number]) Constitutional supine blood pressure is within target range for patient.. pulse regular and within target range for patient.Marland Kitchen respirations regular, non-labored and within target range for patient.Marland Kitchen temperature within target range for patient.. Well-nourished and well-hydrated in no acute distress. Vitals Time Taken: 12:54 PM, Height: 56 in, Source: Measured, Weight: 96 lbs, Source: Measured, BMI: 21.5, Temperature: 98.5 F, Pulse: 76 bpm, Respiratory Rate: 16 breaths/min, Blood Pressure: 102/58 mmHg. Eyes conjunctiva clear no eyelid edema noted. pupils equal round and reactive to light and accommodation. Ears, Nose, Mouth, and Throat no gross abnormality of ear  auricles or external auditory canals. normal hearing noted during conversation. mucus membranes moist. Respiratory normal breathing without difficulty. clear to auscultation bilaterally. Cardiovascular regular rate and rhythm with normal S1, S2. no clubbing, cyanosis, significant edema, Gastrointestinal (GI) soft, non-tender, non-distended, +BS. no ventral hernia noted. Musculoskeletal Patient unable to walk without assistance. Psychiatric Patient is not able to cooperate in decision making regarding care. Patient has dementia. patient is confused. General Notes: Upon thorough inspection of patient's gluteal region there does not appear to be any evidence of skin breakdown although I do see an area of creasing where there appears that may have been a previous breakdown area. This is the region that her sister who was present during evaluation today as well tells me is prone to  reopening and sometimes very quickly. Nonetheless right now I see no evidence of this being a problem. I think that considering the Desitin has done this well for her up to this point that this is likely going to be one of the best things to do and continue to do in order to see this heal and stay healed if it reopens. Assessment Active Problems ICD-10 L22 - Diaper dermatitis Q90.9 - Down syndrome, unspecified F03.91 - Unspecified dementia with behavioral disturbance Plan Hannah Gardner, Hannah Gardner ([id number]) Off-Loading: Turn and reposition every 2 hours [facility] Discharge from Wound Care Center - Keep area clean and dry. Keep zinc oxide on area for protection. Please call our office if you have any questions or concerns. At this time I'm recommending the touch free care spray Desitin for the patient and I did give her a prescription written out for this today for them to take to [facility] and see if they could obtain this. This would take the place of the rubble and Desitin. Otherwise if this cannot be obtained I would recommend continuing with the current Desitin cream along with regular brief changes every two hours as they have been performing at this point. This seems to been very beneficial and I think that's good to continue. Otherwise we will see the patient in the future as needed if anything changes or worsens and I discussed this with patient sister today she was appreciative. Otherwise we will discontinue wound care services at this point. Electronic Signature(s) Signed: 02/09/2018 5:42:34 PM By: Lenda KelpStone III, Hoyt PA-C Entered By: Lenda KelpStone III, Hoyt on 02/09/2018 17:41:40 Hannah Gardner, Hannah Gardner ([id number]) -------------------------------------------------------------------------------- ROS/PFSH Details Patient Name: Hannah Gardner, Hannah Gardner Date of Service: 02/09/2018 12:30 PM Medical Record Number: [id number] Patient Account Number: [id number] Date of Birth/Sex: 1966-07-01 (52 y.o. F) Treating  RN: Curtis Sitesorthy, Joanna Primary Care Provider: Einar CrowAnderson, Marshall Other Clinician: Referring Provider: Rory PercyMIEDEMA, JAYSON Treating Provider/Extender: Linwood DibblesSTONE III, HOYT Weeks in Treatment: 0 Information Obtained From Caregiver Wound History Do you currently have one or more open woundso Yes How many open wounds do you currently haveo 1 Approximately how long have you had your woundso 6 months How have you been treating your wound(s) until nowo desitin cream Has your wound(s) ever healed and then re-openedo No Have you had any lab work done in the past montho No Have you tested positive for an antibiotic resistant organism (MRSA, VRE)o No Have you tested positive for osteomyelitis (bone infection)o No Have you had any tests for circulation on your legso No Genitourinary Complaints and Symptoms: Positive for: Incontinence/dribbling Constitutional Symptoms (General Health) Complaints and Symptoms: No Complaints or Symptoms Eyes Complaints and Symptoms: No Complaints or Symptoms Ear/Nose/Mouth/Throat Complaints and Symptoms: No Complaints or Symptoms Medical History:  Past Medical History Notes: cleft lip Hematologic/Lymphatic Complaints and Symptoms: No Complaints or Symptoms Medical History: Negative for: Anemia; Hemophilia; Human Immunodeficiency Virus; Lymphedema; Sickle Cell Disease Respiratory Complaints and Symptoms: No Complaints or Symptoms Hannah Gardner, Hannah Gardner ([id number]) Medical History: Negative for: Aspiration; Asthma; Chronic Obstructive Pulmonary Disease (COPD); Pneumothorax; Sleep Apnea; Tuberculosis Past Medical History Notes: seasonal allergies Cardiovascular Complaints and Symptoms: No Complaints or Symptoms Medical History: Negative for: Angina; Arrhythmia; Congestive Heart Failure; Coronary Artery Disease; Deep Vein Thrombosis; Hypertension; Hypotension; Myocardial Infarction; Peripheral Arterial Disease; Peripheral Venous Disease;  Phlebitis; Vasculitis Gastrointestinal Complaints and Symptoms: No Complaints or Symptoms Medical History: Negative for: Cirrhosis ; Colitis; Crohnos; Hepatitis A; Hepatitis B; Hepatitis C Endocrine Complaints and Symptoms: No Complaints or Symptoms Immunological Complaints and Symptoms: No Complaints or Symptoms Integumentary (Skin) Complaints and Symptoms: No Complaints or Symptoms Musculoskeletal Complaints and Symptoms: No Complaints or Symptoms Neurologic Medical History: Positive for: Dementia; Seizure Disorder Past Medical History Notes: downs syndrome, alzheimers Oncologic Complaints and Symptoms: No Complaints or Symptoms Medical History: Negative for: Received Chemotherapy; Received Radiation Hannah Gardner, Hannah Gardner ([id number]) Psychiatric Medical History: Past Medical History Notes: depression Immunizations Pneumococcal Vaccine: Received Pneumococcal Vaccination: No Implantable Devices Family and Social History Cancer: No; Diabetes: No; Heart Disease: Yes - Father,Paternal Grandparents; Hereditary Spherocytosis: No; Hypertension: Yes - Paternal Grandparents,Father; Kidney Disease: No; Lung Disease: No; Seizures: No; Stroke: No; Thyroid Problems: No; Tuberculosis: No; Never smoker; Marital Status - Single; Alcohol Use: Never; Drug Use: No History; Financial Concerns: No; Food, Clothing or Shelter Needs: No; Support System Lacking: No; Transportation Concerns: No; Advanced Directives: Yes; Medical Power of Attorney: Yes - sister Hannah Gardner Electronic Signature(s) Signed: 02/09/2018 3:48:45 PM By: Curtis Sites Signed: 02/09/2018 5:42:34 PM By: Lenda Kelp PA-C Entered By: Curtis Sites on 02/09/2018 13:04:25 Hannah Gardner ([id number]) -------------------------------------------------------------------------------- SuperBill Details Patient Name: Hannah Gardner Date of Service: 02/09/2018 Medical Record Number: [id number] Patient Account Number: [id number] Date of  Birth/Sex: 08-Dec-1965 (52 y.o. F) Treating RN: Phillis Haggis Primary Care Provider: Einar Crow Other Clinician: Referring Provider: Rory Percy Treating Provider/Extender: Linwood Dibbles, HOYT Weeks in Treatment: 0 Diagnosis Coding ICD-10 Codes Code Description L22 Diaper dermatitis Q90.9 Down syndrome, unspecified F03.91 Unspecified dementia with behavioral disturbance Facility Procedures CPT4 Code: [id number] Description: [phone] - WOUND CARE VISIT-LEV 2 EST PT Modifier: Quantity: 1 Physician Procedures CPT4 Code: [id number] Description: WC PHYS LEVEL 3 o NEW PT ICD-10 Diagnosis Description L22 Diaper dermatitis Q90.9 Down syndrome, unspecified F03.91 Unspecified dementia with behavioral disturbance Modifier: Quantity: 1 Electronic Signature(s) Signed: 02/09/2018 5:42:34 PM By: Lenda Kelp PA-C Entered By: Lenda Kelp on 02/09/2018 17:41:54

## 2018-02-23 ENCOUNTER — Encounter: Payer: Self-pay | Admitting: *Deleted

## 2018-02-24 ENCOUNTER — Encounter: Payer: Self-pay | Admitting: *Deleted

## 2018-02-24 ENCOUNTER — Other Ambulatory Visit: Payer: Self-pay

## 2018-02-24 ENCOUNTER — Ambulatory Visit: Payer: Medicare Other | Admitting: Certified Registered"

## 2018-02-24 ENCOUNTER — Encounter
Admission: RE | Disposition: A | Discharge status: Home / Self Care | Payer: Self-pay | Source: Ambulatory Visit | Attending: Internal Medicine

## 2018-02-24 ENCOUNTER — Ambulatory Visit
Admission: RE | Admit: 2018-02-24 | Discharge: 2018-02-24 | Disposition: A | Discharge status: Home / Self Care | Payer: Medicare Other | Source: Ambulatory Visit | Attending: Internal Medicine | Admitting: Internal Medicine

## 2018-02-24 DIAGNOSIS — G309 Alzheimer's disease, unspecified: Secondary | ICD-10-CM | POA: Diagnosis not present

## 2018-02-24 DIAGNOSIS — Z79899 Other long term (current) drug therapy: Secondary | ICD-10-CM | POA: Insufficient documentation

## 2018-02-24 DIAGNOSIS — F0280 Dementia in other diseases classified elsewhere without behavioral disturbance: Secondary | ICD-10-CM | POA: Diagnosis not present

## 2018-02-24 DIAGNOSIS — K222 Esophageal obstruction: Secondary | ICD-10-CM | POA: Insufficient documentation

## 2018-02-24 DIAGNOSIS — K210 Gastro-esophageal reflux disease with esophagitis: Secondary | ICD-10-CM | POA: Diagnosis not present

## 2018-02-24 DIAGNOSIS — G40909 Epilepsy, unspecified, not intractable, without status epilepticus: Secondary | ICD-10-CM | POA: Diagnosis not present

## 2018-02-24 DIAGNOSIS — K2950 Unspecified chronic gastritis without bleeding: Secondary | ICD-10-CM | POA: Diagnosis not present

## 2018-02-24 DIAGNOSIS — Q909 Down syndrome, unspecified: Secondary | ICD-10-CM | POA: Diagnosis not present

## 2018-02-24 HISTORY — PX: ESOPHAGOGASTRODUODENOSCOPY (EGD) WITH PROPOFOL: SHX5813

## 2018-02-24 SURGERY — ESOPHAGOGASTRODUODENOSCOPY (EGD) WITH PROPOFOL
Anesthesia: General

## 2018-02-24 MED ORDER — ONDANSETRON HCL 4 MG/2ML IJ SOLN
INTRAMUSCULAR | Status: DC | PRN
  Administered 2018-02-24: 4 mg via INTRAVENOUS

## 2018-02-24 MED ORDER — ONDANSETRON HCL 4 MG/2ML IJ SOLN
INTRAMUSCULAR | Status: AC
  Filled 2018-02-24: qty 2

## 2018-02-24 MED ORDER — MIDAZOLAM HCL 5 MG/5ML IJ SOLN
INTRAMUSCULAR | Status: DC | PRN
  Administered 2018-02-24: 2 mg via INTRAVENOUS

## 2018-02-24 MED ORDER — MIDAZOLAM HCL 2 MG/2ML IJ SOLN
INTRAMUSCULAR | Status: AC
  Filled 2018-02-24: qty 2

## 2018-02-24 MED ORDER — PROPOFOL 10 MG/ML IV BOLUS
INTRAVENOUS | Status: DC | PRN
  Administered 2018-02-24: 20 mg via INTRAVENOUS
  Administered 2018-02-24: 10 mg via INTRAVENOUS
  Administered 2018-02-24 (×2): 20 mg via INTRAVENOUS

## 2018-02-24 MED ORDER — PROPOFOL 10 MG/ML IV BOLUS
INTRAVENOUS | Status: AC
  Filled 2018-02-24: qty 20

## 2018-02-24 MED ORDER — SODIUM CHLORIDE 0.9 % IV SOLN
INTRAVENOUS | Status: DC
  Administered 2018-02-24: 09:00:00 via INTRAVENOUS

## 2018-02-24 MED ORDER — GLYCOPYRROLATE 0.2 MG/ML IJ SOLN
INTRAMUSCULAR | Status: DC | PRN
  Administered 2018-02-24: 0.1 mg via INTRAVENOUS

## 2018-02-24 MED ORDER — LIDOCAINE HCL (CARDIAC) 20 MG/ML IV SOLN
INTRAVENOUS | Status: DC | PRN
  Administered 2018-02-24: 20 mg via INTRATRACHEAL

## 2018-02-24 MED ORDER — GLYCOPYRROLATE 0.2 MG/ML IJ SOLN
INTRAMUSCULAR | Status: AC
  Filled 2018-02-24: qty 1

## 2018-02-24 NOTE — Anesthesia Postprocedure Evaluation (Signed)
Anesthesia Post Note  Patient: Hannah Gardner  Procedure(s) Performed: ESOPHAGOGASTRODUODENOSCOPY (EGD) WITH PROPOFOL (N/A )  Patient location during evaluation: Endoscopy Anesthesia Type: General Level of consciousness: awake and alert Pain management: pain level controlled Vital Signs Assessment: post-procedure vital signs reviewed and stable Respiratory status: spontaneous breathing, nonlabored ventilation and respiratory function stable Cardiovascular status: blood pressure returned to baseline and stable Postop Assessment: no signs of nausea or vomiting Anesthetic complications: no     Last Vitals:  Vitals:   02/24/18 0927 02/24/18 0937  BP: (!) 95/53 109/62  Resp: (!) 8   Temp:      Last Pain:  Vitals:   02/24/18 0947  TempSrc:   PainSc: Asleep                 Delayne Sanzo

## 2018-02-24 NOTE — Anesthesia Preprocedure Evaluation (Addendum)
Anesthesia Evaluation  Patient identified by MRN, date of birth, ID band Patient awake    Reviewed: Allergy & Precautions, NPO status , Patient's Chart, lab work & pertinent test results  History of Anesthesia Complications (+) history of anesthetic complications (adverse reaction to sedation in past -aggression )  Airway   TM Distance: >3 FB Neck ROM: Full   Comment: Pt unable to cooperate with exam Dental no notable dental hx.    Pulmonary neg pulmonary ROS, neg sleep apnea, neg COPD,    breath sounds clear to auscultation- rhonchi (-) wheezing      Cardiovascular (-) hypertension(-) CAD, (-) Past MI, (-) Cardiac Stents and (-) CABG  Rhythm:Regular Rate:Normal - Systolic murmurs and - Diastolic murmurs    Neuro/Psych Seizures -,  PSYCHIATRIC DISORDERS Anxiety Depression Dementia    GI/Hepatic negative GI ROS, Neg liver ROS,   Endo/Other  negative endocrine ROSneg diabetes  Renal/GU negative Renal ROS     Musculoskeletal negative musculoskeletal ROS (+)   Abdominal (+) - obese,   Peds  Hematology negative hematology ROS (+)   Anesthesia Other Findings Past Medical History: No date: Abnormal uterine bleeding (AUB) No date: Anxiety No date: Cleft lip No date: Complication of anesthesia     Comment:  pt was given 2 shots for sedation for a past surgery               that made pt aggressive.  Mom went back with pt for last               surgery, pt did well. No date: Dementia No date: Dementia No date: Depression No date: Down's syndrome No date: Hypsarrhythmia (HCC) No date: Irregular periods/menstrual cycles No date: Seizures [facility](HCC)     Comment:  last episode 07/19/16.  2-3x/mo   Reproductive/Obstetrics                            Anesthesia Physical Anesthesia Plan  ASA: III  Anesthesia Plan: General   Post-op Pain Management:    Induction: Intravenous  PONV Risk Score and  Plan: 2 and Propofol infusion  Airway Management Planned: Natural Airway  Additional Equipment:   Intra-op Plan:   Post-operative Plan:   Informed Consent: I have reviewed the patients History and Physical, chart, labs and discussed the procedure including the risks, benefits and alternatives for the proposed anesthesia with the patient or authorized representative who has indicated his/her understanding and acceptance.   Dental advisory given  Plan Discussed with: CRNA and Anesthesiologist  Anesthesia Plan Comments:         Anesthesia Quick Evaluation

## 2018-02-24 NOTE — H&P (Signed)
Outpatient short stay form Pre-procedure 02/24/2018 8:28 AM Hannah Gardner K. Hannah Gardner, M.D.  Primary Physician: Hannah Gardner, M.D.  Reason for visit: Dysphagia, weight loss  History of present illness: 52 year old female with a history of Down syndrome, autism, Alzheimer's dementia and seizure disorder presents for presumed dysphagia.  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  Patient is mostly nonverbal and has been accompanied by her brother, Hannah Gardner, who is her durable power of attorney.  After discussing the case, benefits and risks of the procedure along with consultation with the patient's neurologist, to Hannah Gardner has decided to proceed with EGD.    Current Facility-Administered Medications:  .  0.9 %  sodium chloride infusion, , Intravenous, Continuous, Hannah Gardner, Hannah Nearing, MD  Medications Prior to Admission  Medication Sig Dispense Refill Last Dose  . acetaminophen (TYLENOL) 325 MG tablet Take 650 mg by mouth every 4 (four) hours as needed for mild pain or fever.    Past Week at Unknown time  . aluminum-magnesium hydroxide-simethicone (MAALOX) 200-200-20 MG/5ML SUSP Take 30 mLs by mouth every 4 (four) hours as needed (upset stomach).   Past Week at Unknown time  . ammonium lactate (AMLACTIN) 12 % cream Apply topically 2 (two) times daily as needed for dry skin.    02/23/2018 at 0800  . calamine lotion Apply 1 application topically as needed for itching.   02/23/2018 at 0800  . calcium carbonate (OS-CAL - DOSED IN MG OF ELEMENTAL CALCIUM) 1250 (500 Ca) MG tablet Take 1 tablet by mouth.   Past Week at Unknown time  . Calcium Carbonate-Vitamin D (CALCIUM-VITAMIN D) 500-200 MG-UNIT per tablet Take 1 tablet by mouth 2 (two) times daily.   Past Week at Unknown time  . divalproex (DEPAKOTE SPRINKLE) 125 MG capsule    Past Week at Unknown time  . escitalopram (LEXAPRO) 10 MG tablet Take 10 mg by mouth 2 (two) times daily.    Past Week at Unknown time  . hydrocerin (EUCERIN) CREA Apply 1 application topically at bedtime.   02/23/2018 at 2000  . hydrocortisone 2.5 % cream Apply topically 2 (two) times daily.   02/23/2018 at 2000  . hydrogen peroxide 1.5 % SOLN Place 4 drops into both ears See admin instructions. 2 times a month   Past Month at Unknown time  . levETIRAcetam (KEPPRA) 500 MG tablet Take 3 tablets (1,500 mg total) by mouth 2 (two) times daily. 180 tablet 2 02/24/2018 at 0600  . levETIRAcetam (KEPPRA) 750 MG tablet    02/24/2018 at 0600  . loperamide (IMODIUM) 2 MG capsule Take 4 mg by mouth.   02/23/2018 at 2000  . loratadine (CLARITIN) 10 MG tablet Take by mouth.   02/23/2018 at 2000  . LORazepam (ATIVAN) 0.5 MG tablet Take 0.5 mg by mouth as needed. For agitation; may repeat in 20 minutes with RN approval   02/23/2018 at 2000  . LORazepam (ATIVAN) 0.5 MG tablet Take 0.5 mg by mouth every 6 (six) hours as needed. Prior to family transport or on home visits   Past Week at Unknown time  . LORazepam (ATIVAN) 0.5 MG tablet Take 1 tablet (0.5 mg total) by mouth as needed. take 1 tablet by mouth after a seizure, but only up to 3 times within a 24-hour period. 20 tablet 0 Past Week at Unknown time  . magnesium hydroxide (MILK OF MAGNESIA) 400 MG/5ML suspension Take 30 mLs by mouth every three (3) days as  needed for mild constipation or moderate constipation.    Past Month at Unknown time  . memantine (NAMENDA XR) 28 MG CP24 24 hr capsule Take 28 mg by mouth at bedtime.   Past Week at Unknown time  . Multiple Vitamin (MULTIVITAMIN WITH MINERALS) TABS tablet Take 1 tablet by mouth daily.   Past Week at Unknown time  . polymixin-bacitracin (POLYSPORIN) 500-10000 UNIT/GM OINT ointment Apply 1 application topically as needed. For minor abrasions, scratches, or excoriated areas   Past Week at Unknown time  . Pseudoeph-Chlorphen-DM (KIDKARE COUGH/COLD) 15-1-5 MG/5ML LIQD Take 10 mLs by mouth every 6 (six) hours as needed. For  cold symptoms   02/23/2018 at 2000  . pseudoephedrine (SUDAFED) 120 MG 12 hr tablet Take by mouth.   Past Week at Unknown time  . rivastigmine (EXELON) 9.5 mg/24hr Place 9.5 mg onto the skin daily.   02/23/2018 at 2000  . senna (SENOKOT) 8.6 MG tablet Take by mouth.   Past Week at Unknown time  . SUNSCREEN SPF30 EX Apply topically as needed. Prior to sun exposure   Past Week at Unknown time  . Talc (BABY POWDER EX) Apply 1 application topically daily as needed (for moisture protection).   Past Week at Unknown time  . traZODone (DESYREL) 50 MG tablet Take 100 mg by mouth at bedtime as needed for sleep. Between 12 am-2 am    02/23/2018 at 2000  . triamcinolone ointment (KENALOG) 0.1 % Apply 1 application topically 2 (two) times daily as needed (for red, itchy skin).    02/23/2018 at Unknown time  . VIMPAT 150 MG TABS 150 mg 2 (two) times daily.    02/23/2018 at 2000     Allergies  Allergen Reactions  . Lamotrigine Rash    Rash on torso and arms     Past Medical History:  Diagnosis Date  . Abnormal uterine bleeding (AUB)   . Anxiety   . Cleft lip   . Complication of anesthesia    pt was given 2 shots for sedation for a past surgery that made pt aggressive.  Mom went back with pt for last surgery, pt did well.  . Dementia   . Dementia   . Depression   . Down's syndrome   . Hypsarrhythmia (HCC)   . Irregular periods/menstrual cycles   . Seizures (HCC)    last episode 07/19/16.  2-3x/mo    Review of systems:      Physical Exam  Gen: Alert, oriented. Appears stated age.  HEENT: [place]/AT. PERRLA. Lungs: CTA, no wheezes. CV: RR nl S1, S2. Abd: soft, benign, no masses. BS+ Ext: No edema. Pulses 2+    Planned procedures: EGD. The patient's brother and POA, Hannah Gardner,  understands the nature of the planned procedure, indications, risks, alternatives and potential complications including but not limited to bleeding, infection, perforation, damage to internal organs and possible  oversedation/side effects from anesthesia. He agrees and gives consent to proceed with EGD on Ms. Hannah Gardner, the patient.  Please refer to procedure notes for findings, recommendations and patient disposition/instructions.     Keller Bounds K. Hannah Fredricksonoledo, M.D. Gastroenterology 02/24/2018  8:28 AM

## 2018-02-24 NOTE — Anesthesia Postprocedure Evaluation (Signed)
Anesthesia Post Note  Patient: Hannah Gardner  Procedure(s) Performed: ESOPHAGOGASTRODUODENOSCOPY (EGD) WITH PROPOFOL (N/A )  Patient location during evaluation: Endoscopy Anesthesia Type: General Level of consciousness: awake and alert, oriented and patient cooperative Pain management: satisfactory to patient Vital Signs Assessment: post-procedure vital signs reviewed and stable Respiratory status: spontaneous breathing and respiratory function stable Cardiovascular status: blood pressure returned to baseline and stable Postop Assessment: no headache, no backache, patient able to bend at knees and no apparent nausea or vomiting Anesthetic complications: no     Last Vitals:  Vitals:   02/24/18 0917  BP: (!) 90/50  Temp: (!) 36.1 C    Last Pain:  Vitals:   02/24/18 0917  TempSrc: Tympanic  PainSc: Asleep                 Catheryn BaconJosephine H Kaianna Dolezal

## 2018-02-24 NOTE — Anesthesia Procedure Notes (Addendum)
Procedure Name: General with mask airway Date/Time: 02/24/2018 8:43 AM Performed by: Sherol DadeMacMang, Greer Wainright H, CRNA Pre-anesthesia Checklist: Patient identified, Emergency Drugs available, Suction available, Patient being monitored and Timeout performed Patient Re-evaluated:Patient Re-evaluated prior to induction Oxygen Delivery Method: Circle system utilized Preoxygenation: Pre-oxygenation with 100% oxygen Induction Type: Inhalational induction Ventilation: Mask ventilation without difficulty Comments: Mask inhalation for IV placement then switched to [place] for remainder of procedure.

## 2018-02-24 NOTE — Transfer of Care (Signed)
Immediate Anesthesia Transfer of Care Note  Patient: Hannah Gardner  Procedure(s) Performed: ESOPHAGOGASTRODUODENOSCOPY (EGD) WITH PROPOFOL (N/A )  Patient Location: PACU  Anesthesia Type:General  Level of Consciousness: drowsy  Airway & Oxygen Therapy: Patient Spontanous Breathing and Patient connected to nasal cannula oxygen  Post-op Assessment: Report given to RN, Post -op Vital signs reviewed and stable and Patient moving all extremities  Post vital signs: Reviewed and stable  Last Vitals:  Vitals Value Taken Time  BP 90/50 02/24/2018  9:17 AM  Temp 36.1 C 02/24/2018  9:17 AM  Pulse 76 02/24/2018  9:19 AM  Resp 7 02/24/2018  9:19 AM  SpO2 99 % 02/24/2018  9:19 AM  Vitals shown include unvalidated device data.  Last Pain:  Vitals:   02/24/18 0917  TempSrc: Tympanic  PainSc: Asleep         Complications: No apparent anesthesia complications

## 2018-02-24 NOTE — Op Note (Signed)
[facility] Gastroenterology Patient Name: Hannah Gardner Procedure Date: 02/24/2018 8:30 AM MRN: [id number] Account #: [id number] Date of Birth: [birth year] Admit Type: Outpatient Age: 52 Room: [facility] ENDO ROOM 4 Gender: Female Note Status: Finalized Procedure:            Upper GI endoscopy Indications:          Dysphagia, Abnormal UGI series, Weight loss Providers:            Boykin Nearing. Norma Fredrickson MD, MD Referring MD:         Marya Amsler. Dareen Piano MD, MD (Referring MD) Medicines:            Propofol per Anesthesia Complications:        No immediate complications. Procedure:            Pre-Anesthesia Assessment:                       - The risks and benefits of the procedure and the                        sedation options and risks were discussed with the                        patient. All questions were answered and informed                        consent was obtained.                       - Patient identification and proposed procedure were                        verified prior to the procedure by the nurse. The                        procedure was verified in the procedure room.                       - ASA Grade Assessment: III - A patient with severe                        systemic disease.                       - After reviewing the risks and benefits, the patient                        was deemed in satisfactory condition to undergo the                        procedure in an ambulatory setting.                       After obtaining informed consent, the endoscope was                        passed under direct vision. Throughout the procedure,                        the patient's blood pressure, pulse, and oxygen  saturations were monitored continuously. The Endoscope                        was introduced through the mouth, and advanced to the                        third part of duodenum. The upper GI endoscopy was   accomplished without difficulty. The patient tolerated                        the procedure well. Findings:      The Z-line was irregular and was found in the distal esophagus. Mucosa       was biopsied with a cold forceps for histology. One specimen bottle was       sent to pathology.      One benign-appearing, intrinsic mild stenosis was found at the       gastroesophageal junction. This stenosis measured 1.3 cm (inner       diameter) x less than one cm (in length). The stenosis was traversed. A       TTS dilator was passed through the scope. Dilation with an 18-19-20 mm       balloon dilator was performed to 20 mm. The dilation site was examined       following endoscope reinsertion and showed mild improvement in luminal       narrowing. Estimated blood loss: none.      Localized moderately erythematous mucosa without bleeding was found in       the gastric antrum. Biopsies were taken with a cold forceps for       Helicobacter pylori testing.      The cardia and gastric fundus were normal on retroflexion.      The examined duodenum was normal.      The exam was otherwise without abnormality. Impression:           - Z-line irregular, in the distal esophagus. Biopsied.                       - Benign-appearing esophageal stenosis. Dilated.                       - Erythematous mucosa in the antrum. Biopsied.                       - Normal examined duodenum.                       - The examination was otherwise normal. Recommendation:       - Patient has a contact number available for                        emergencies. The signs and symptoms of potential                        delayed complications were discussed with the patient.                        Return to normal activities tomorrow. Written discharge                        instructions were provided to the patient.                       -  Resume previous diet.                       - Continue present medications.                        - Await pathology results.                       - Return to my office in 6 weeks.                       - The findings and recommendations were discussed with                        the patient's family. Procedure Code(s):    --- Professional ---                       [phone], Esophagogastroduodenoscopy, flexible, transoral;                        with transendoscopic balloon dilation of esophagus                        (less than 30 mm diameter)                       [id number], Esophagogastroduodenoscopy, flexible, transoral;                        with biopsy, single or multiple Diagnosis Code(s):    --- Professional ---                       R93.3, Abnormal findings on diagnostic imaging of other                        parts of digestive tract                       R63.4, Abnormal weight loss                       R13.10, Dysphagia, unspecified                       K31.89, Other diseases of stomach and duodenum                       K22.2, Esophageal obstruction                       K22.8, Other specified diseases of esophagus CPT copyright 2017 [employer]. All rights reserved. The codes documented in this report are preliminary and upon coder review may  be revised to meet current compliance requirements. Stanton Kidney MD, MD 02/24/2018 9:21:38 AM This report has been signed electronically. Number of Addenda: 0 Note Initiated On: 02/24/2018 8:30 AM      [facility]

## 2018-02-24 NOTE — Anesthesia Post-op Follow-up Note (Signed)
Anesthesia QCDR form completed.        

## 2018-02-24 NOTE — Interval H&P Note (Signed)
History and Physical Interval Note:  02/24/2018 8:31 AM  Hannah Gardner  has presented today for surgery, with the diagnosis of ESOPHAGEAL DYSPHAGIA  The various methods of treatment have been discussed with the patient and family. After consideration of risks, benefits and other options for treatment, the patient has consented to  Procedure(s): ESOPHAGOGASTRODUODENOSCOPY (EGD) WITH PROPOFOL (N/A) as a surgical intervention .  The patient's history has been reviewed, patient examined, no change in status, stable for surgery.  I have reviewed the patient's chart and labs.  Questions were answered to the patient's satisfaction.     [place], [place]

## 2018-03-01 LAB — SURGICAL PATHOLOGY

## 2018-03-16 ENCOUNTER — Other Ambulatory Visit: Payer: Self-pay

## 2018-03-16 ENCOUNTER — Emergency Department
Admission: EM | Admit: 2018-03-16 | Discharge: 2018-03-17 | Disposition: A | Discharge status: Other Acute Inpatient Hospital | Payer: Medicare Other | Attending: Emergency Medicine | Admitting: Emergency Medicine

## 2018-03-16 ENCOUNTER — Encounter: Payer: Self-pay | Admitting: Emergency Medicine

## 2018-03-16 ENCOUNTER — Emergency Department: Payer: Medicare Other

## 2018-03-16 DIAGNOSIS — G309 Alzheimer's disease, unspecified: Secondary | ICD-10-CM | POA: Diagnosis not present

## 2018-03-16 DIAGNOSIS — F0281 Dementia in other diseases classified elsewhere with behavioral disturbance: Secondary | ICD-10-CM | POA: Insufficient documentation

## 2018-03-16 DIAGNOSIS — W19XXXA Unspecified fall, initial encounter: Secondary | ICD-10-CM | POA: Insufficient documentation

## 2018-03-16 DIAGNOSIS — Q909 Down syndrome, unspecified: Secondary | ICD-10-CM | POA: Diagnosis not present

## 2018-03-16 DIAGNOSIS — L989 Disorder of the skin and subcutaneous tissue, unspecified: Secondary | ICD-10-CM | POA: Insufficient documentation

## 2018-03-16 DIAGNOSIS — Y998 Other external cause status: Secondary | ICD-10-CM | POA: Diagnosis not present

## 2018-03-16 DIAGNOSIS — Z79899 Other long term (current) drug therapy: Secondary | ICD-10-CM | POA: Insufficient documentation

## 2018-03-16 DIAGNOSIS — Y939 Activity, unspecified: Secondary | ICD-10-CM | POA: Diagnosis not present

## 2018-03-16 DIAGNOSIS — S0990XA Unspecified injury of head, initial encounter: Secondary | ICD-10-CM | POA: Diagnosis not present

## 2018-03-16 DIAGNOSIS — Y92009 Unspecified place in unspecified non-institutional (private) residence as the place of occurrence of the external cause: Secondary | ICD-10-CM | POA: Diagnosis not present

## 2018-03-16 LAB — BASIC METABOLIC PANEL
Anion gap: 8 (ref 5–15)
BUN: 26 mg/dL — AB (ref 6–20)
CO2: 32 mmol/L (ref 22–32)
CREATININE: 0.65 mg/dL (ref 0.44–1.00)
Calcium: 7.8 mg/dL — ABNORMAL LOW (ref 8.9–10.3)
Chloride: 109 mmol/L (ref 101–111)
Glucose, Bld: 100 mg/dL — ABNORMAL HIGH (ref 65–99)
Potassium: 3.6 mmol/L (ref 3.5–5.1)
SODIUM: 149 mmol/L — AB (ref 135–145)

## 2018-03-16 LAB — CBC WITH DIFFERENTIAL/PLATELET
BASOS PCT: 1 %
Basophils Absolute: 0.1 10*3/uL (ref 0–0.1)
EOS ABS: 0 10*3/uL (ref 0–0.7)
EOS PCT: 0 %
HCT: 35 % (ref 35.0–47.0)
HEMOGLOBIN: 11.7 g/dL — AB (ref 12.0–16.0)
Lymphocytes Relative: 10 %
Lymphs Abs: 1.5 10*3/uL (ref 1.0–3.6)
MCH: 35.1 pg — ABNORMAL HIGH (ref 26.0–34.0)
MCHC: 33.4 g/dL (ref 32.0–36.0)
MCV: 105.1 fL — ABNORMAL HIGH (ref 80.0–100.0)
MONO ABS: 1.1 10*3/uL — AB (ref 0.2–0.9)
MONOS PCT: 8 %
NEUTROS PCT: 81 %
Neutro Abs: 11.4 10*3/uL — ABNORMAL HIGH (ref 1.4–6.5)
Platelets: 312 10*3/uL (ref 150–440)
RBC: 3.33 MIL/uL — ABNORMAL LOW (ref 3.80–5.20)
RDW: 14.6 % — AB (ref 11.5–14.5)
WBC: 14.1 10*3/uL — AB (ref 3.6–11.0)

## 2018-03-16 NOTE — ED Notes (Signed)
Pt and family updated on treatment plan by MD. Pt remains in NAD and has family and caregiver at bedside.

## 2018-03-16 NOTE — ED Provider Notes (Signed)
[facility] Emergency Department Provider Note  ____________________________________________   I have reviewed the triage vital signs and the nursing notes.   HISTORY  Chief Complaint Swelling to head  History limited by and level 5 caveat due to: Down Syndrome   HPI Hannah Gardner is a 52 y.o. female who presents to the emergency department today because of concerns for swelling to her right temporal region.  Family states that this started almost 1 month ago.  She did have a fall which resulted in a black eye and a small hematoma to that side.  However it was not until roughly a week after the fall that the swelling became worse.  It continued to get larger and in the past few days has become more red and hot to the touch.  Family has noticed that the patient is also appeared to have had decreased appetite over the past couple of days.    Per medical record review patient has a history of down syndrome.   Past Medical History:  Diagnosis Date  . Abnormal uterine bleeding (AUB)   . Anxiety   . Cleft lip   . Complication of anesthesia    pt was given 2 shots for sedation for a past surgery that made pt aggressive.  Mom went back with pt for last surgery, pt did well.  . Dementia   . Dementia   . Depression   . Down's syndrome   . Hypsarrhythmia (HCC)   . Irregular periods/menstrual cycles   . Seizures (HCC)    last episode 07/19/16.  2-3x/mo    Patient Active Problem List   Diagnosis Date Noted  . Rectal inflammation 11/25/2017  . Menopause 11/25/2017  . Aspiration into airway 05/04/2017  . Ataxia 04/29/2017  . Mild protein-calorie malnutrition (HCC) 01/14/2017  . Mass of upper outer quadrant of right breast 11/19/2016  . Difficulty in walking 11/23/2014  . Seizure (HCC) 11/23/2014  . Disordered sleep 11/23/2014  . 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 AD (Alzheimer's disease) 04/05/2014  . 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 CERUMEN REMOVAL Bilateral 04/18/2015   Procedure: CERUMEN REMOVAL, Bilateral ;  Surgeon: Geanie Logan, MD;  Location: [facility] OR[facility];  Service: ENT;  Laterality: Bilateral;  . CERUMEN REMOVAL Bilateral 08/05/2016   Procedure: CERUMEN REMOVAL;  Surgeon: Geanie Logan, MD;  Location: [facility];  Service: ENT;  Laterality: Bilateral;  . EAR EXAMINATION UNDER ANESTHESIA Bilateral    remove cerumen from ears  . ESOPHAGOGASTRODUODENOSCOPY (EGD) WITH PROPOFOL N/A 02/24/2018   Procedure: ESOPHAGOGASTRODUODENOSCOPY (EGD) WITH PROPOFOL;  Surgeon: Toledo, Boykin Nearing, MD;  Location: [facility];  Service: Gastroenterology;  Laterality: N/A;  . TOOTH EXTRACTION      Prior to Admission medications   Medication Sig Start Date End Date Taking? Authorizing Provider  acetaminophen (TYLENOL) 325 MG tablet Take 650 mg by mouth every 4 (four) hours as needed for mild pain or fever.     [provider]  aluminum-magnesium hydroxide-simethicone (MAALOX) 200-200-20 MG/5ML SUSP Take 30 mLs by mouth every 4 (four) hours as needed (upset stomach).    [provider]  ammonium lactate (AMLACTIN) 12 % cream Apply topically 2 (two) times daily as needed for dry skin.     [provider]  calamine lotion Apply 1 application topically as needed for itching.    [provider]  calcium carbonate (OS-CAL - DOSED IN MG OF  ELEMENTAL CALCIUM) 1250 (500 Ca) MG tablet Take 1 tablet by mouth.    [provider]  Calcium Carbonate-Vitamin D (CALCIUM-VITAMIN D) 500-200 MG-UNIT per tablet Take 1 tablet by mouth 2 (two) times daily.    [provider]  divalproex (DEPAKOTE SPRINKLE) 125 MG capsule  05/23/17   [provider]  escitalopram (LEXAPRO) 10 MG tablet Take 10 mg by mouth 2 (two) times daily.    [provider]  hydrocerin (EUCERIN) CREA Apply 1 application topically at bedtime.     [provider]  hydrocortisone 2.5 % cream Apply topically 2 (two) times daily.    [provider]  hydrogen peroxide 1.5 % SOLN Place 4 drops into both ears See admin instructions. 2 times a month    [provider]  levETIRAcetam (KEPPRA) 500 MG tablet Take 3 tablets (1,500 mg total) by mouth 2 (two) times daily. 05/09/16   Emily Filbert, MD  levETIRAcetam (KEPPRA) 750 MG tablet  05/23/17   [provider]  loperamide (IMODIUM) 2 MG capsule Take 4 mg by mouth.    [provider]  loratadine (CLARITIN) 10 MG tablet Take by mouth. 04/29/17 04/29/18  [provider]  LORazepam (ATIVAN) 0.5 MG tablet Take 0.5 mg by mouth as needed. For agitation; may repeat in 20 minutes with RN approval    [provider]  LORazepam (ATIVAN) 0.5 MG tablet Take 0.5 mg by mouth every 6 (six) hours as needed. Prior to family transport or on home visits    [provider]  LORazepam (ATIVAN) 0.5 MG tablet Take 1 tablet (0.5 mg total) by mouth as needed. take 1 tablet by mouth after a seizure, but only up to 3 times within a 24-hour period. 08/10/17 08/10/18  Minna Antis, MD  magnesium hydroxide (MILK OF MAGNESIA) 400 MG/5ML suspension Take 30 mLs by mouth every three (3) days as needed for mild constipation or moderate constipation.     [provider]  memantine (NAMENDA XR) 28 MG CP24 24 hr capsule Take 28 mg by mouth at bedtime.    [provider]  Multiple Vitamin (MULTIVITAMIN WITH MINERALS) TABS tablet Take 1 tablet by mouth daily.    [provider]  polymixin-bacitracin (POLYSPORIN) 500-10000 UNIT/GM OINT ointment Apply 1 application topically as needed. For minor abrasions, scratches, or excoriated areas    [provider]  Pseudoeph-Chlorphen-DM [facility] COUGH/COLD) 15-1-5 MG/5ML LIQD Take 10 mLs by mouth every 6 (six) hours as needed. For cold symptoms    [provider]  pseudoephedrine  (SUDAFED) 120 MG 12 hr tablet Take by mouth. 12/10/16   [provider]  rivastigmine (EXELON) 9.5 mg/24hr Place 9.5 mg onto the skin daily.    [provider]  senna (SENOKOT) 8.6 MG tablet Take by mouth.    [provider]  SUNSCREEN SPF30 EX Apply topically as needed. Prior to sun exposure    [provider]  Talc (BABY POWDER EX) Apply 1 application topically daily as needed (for moisture protection).    [provider]  traZODone (DESYREL) 50 MG tablet Take 100 mg by mouth at bedtime as needed for sleep. Between 12 am-2 am     [provider]  triamcinolone ointment (KENALOG) 0.1 % Apply 1 application topically 2 (two) times daily as needed (for red, itchy skin).     [provider]  VIMPAT 150 MG TABS 150 mg 2 (two) times daily.  10/23/16   [provider]  Allergies Lamotrigine  Family History  Problem Relation Age of Onset  . Cancer Neg Hx   . Diabetes Neg Hx   . Heart disease Neg Hx   . Breast cancer Neg Hx     Social History Social History   Tobacco Use  . Smoking status: Never Smoker  . Smokeless tobacco: Never Used  Substance Use Topics  . Alcohol use: No  . Drug use: No    Review of Systems Unable to obtain secondary to down syndrome. ____________________________________________   PHYSICAL EXAM:  VITAL SIGNS: ED Triage Vitals  Enc Vitals Group     BP 03/16/18 1647 111/69     Pulse Rate 03/16/18 1647 (!) 106     Resp 03/16/18 1647 14     Temp 03/16/18 1647 99.1 F (37.3 C)     Temp Source 03/16/18 1647 Axillary     SpO2 03/16/18 1647 93 %     Weight 03/16/18 1654 96 lb (43.5 kg)   Constitutional: Awake and alert. Sequale of down's syndrome.  Eyes: Conjunctivae are normal.  ENT   Head: Normocephalic. Large area of swelling to right temporal region.   Nose: No congestion/rhinnorhea.   Mouth/Throat: Mucous membranes are moist.   Neck: No  stridor. Hematological/Lymphatic/Immunilogical: No cervical lymphadenopathy. Cardiovascular: Normal rate, regular rhythm.  No murmurs, rubs, or gallops.  Respiratory: Normal respiratory effort without tachypnea nor retractions. Breath sounds are clear and equal bilaterally. No wheezes/rales/rhonchi. Gastrointestinal: Soft and non tender. No rebound. No guarding.  Genitourinary: Deferred Musculoskeletal: Normal range of motion in all extremities.  Neurologic:  Down syndrome. Moving all extremities. Sensation intact.  Skin:  Area of erythema and fluctuance noted to right temple. ____________________________________________    LABS (pertinent positives/negatives)  BMP na 149, k 3.6, glu 100, cr 0.65 CBC wbc 14.1, hgb 11.7, plt 312  ____________________________________________   EKG  None  ____________________________________________    RADIOLOGY  CT head Large heterogenous collection to right temple, concern for bone erosion and mild diastasis of nearby suture.   ____________________________________________   PROCEDURES  Procedures  ____________________________________________   INITIAL IMPRESSION / ASSESSMENT AND PLAN / ED COURSE  Pertinent labs & imaging results that were available during my care of the patient were reviewed by me and considered in my medical decision making (see chart for details).  Patient presented to the emergency department today because of concern for increasing right temporal swelling.  On exam there is erythema and fluctuance to the area.  CT scan does show a large heterogeneous collection.  Concern for infection versus neoplasm.  Given clinical exam I would have more concerned for infection.  Discussed with Dr. Riley Nearing with neurosurgery. Given concern for possible cranial involvement did recommend transfer to another facility. Additionally recommended defering antibiotics until wound culture could be sent. Will plan on transfer to [facility]. Discussed findings and plan with family and caregivers.    ____________________________________________   FINAL CLINICAL IMPRESSION(S) / ED DIAGNOSES  Final diagnoses:  Scalp lesion     Note: This dictation was prepared with Dragon dictation. Any transcriptional errors that result from this process are unintentional     Phineas Semen, MD 03/17/18 1801

## 2018-03-16 NOTE — ED Triage Notes (Signed)
Pt presents to ED with caregiver c/o large swelling to R side of head. Caregiver reports pt had fall 2 months ago and has had R-sided swelling since. Pt had XR done by PCP who stated swelling should gradually decrease, caregiver reports swelling does not appear to have decreased at all and is requesting CT scan. Pt has hx Down's Syndrome, is nonverbal at baseline. Caregiver reports no change in behavior since fall.

## 2018-03-17 MED ORDER — ESCITALOPRAM OXALATE 10 MG PO TABS
10.00 | ORAL_TABLET | ORAL | Status: DC
Start: 2018-03-18 — End: 2018-03-17

## 2018-03-17 MED ORDER — LOPERAMIDE HCL 2 MG PO CAPS
2.00 | ORAL_CAPSULE | ORAL | Status: DC
Start: ? — End: 2018-03-17

## 2018-03-17 MED ORDER — GENERIC EXTERNAL MEDICATION
1250.00 | Status: DC
Start: 2018-03-17 — End: 2018-03-17

## 2018-03-17 MED ORDER — EUCERIN EX CREA
TOPICAL_CREAM | CUTANEOUS | Status: DC
Start: 2018-03-18 — End: 2018-03-17

## 2018-03-17 MED ORDER — POLYETHYLENE GLYCOL 3350 17 G PO PACK
17.00 | PACK | ORAL | Status: DC
Start: ? — End: 2018-03-17

## 2018-03-17 MED ORDER — LORAZEPAM 0.5 MG PO TABS
0.50 | ORAL_TABLET | ORAL | Status: DC
Start: 2018-03-18 — End: 2018-03-17

## 2018-03-17 MED ORDER — GENERIC EXTERNAL MEDICATION
Status: DC
Start: ? — End: 2018-03-17

## 2018-03-17 MED ORDER — LIDOCAINE HCL (PF) 1 % IJ SOLN
.50 | INTRAMUSCULAR | Status: DC
Start: ? — End: 2018-03-17

## 2018-03-17 MED ORDER — ACETAMINOPHEN 325 MG PO TABS
325.00 | ORAL_TABLET | ORAL | Status: DC
Start: ? — End: 2018-03-17

## 2018-03-17 MED ORDER — TRAZODONE HCL 50 MG PO TABS
100.00 | ORAL_TABLET | ORAL | Status: DC
Start: 2018-03-17 — End: 2018-03-17

## 2018-03-17 MED ORDER — GENERIC EXTERNAL MEDICATION
200.00 | Status: DC
Start: 2018-03-17 — End: 2018-03-17

## 2018-03-17 MED ORDER — DOCUSATE SODIUM 100 MG PO CAPS
100.00 | ORAL_CAPSULE | ORAL | Status: DC
Start: 2018-03-18 — End: 2018-03-17

## 2018-03-17 MED ORDER — HEPARIN SODIUM (PORCINE) 5000 UNIT/ML IJ SOLN
5000.00 | INTRAMUSCULAR | Status: DC
Start: 2018-03-17 — End: 2018-03-17

## 2018-03-17 MED ORDER — DIVALPROEX SODIUM 125 MG PO CSDR
375.00 | DELAYED_RELEASE_CAPSULE | ORAL | Status: DC
Start: 2018-03-17 — End: 2018-03-17

## 2018-03-17 MED ORDER — RIVASTIGMINE 13.3 MG/24HR TD PT24
1.00 | MEDICATED_PATCH | TRANSDERMAL | Status: DC
Start: 2018-03-18 — End: 2018-03-17

## 2018-03-17 NOTE — ED Notes (Signed)
EMTALA checked for completion  

## 2018-07-01 IMAGING — RF DG SWALLOWING FUNCTION
9 series · 13 of 24 positions shown · non-contrast
Comparison: None.

CLINICAL DATA: Weight loss, dysphagia

EXAM:
MODIFIED BARIUM SWALLOW
TECHNIQUE: Different consistencies of barium were administered orally to the
patient by the Speech Pathologist. Imaging of the pharynx was
performed in the lateral projection.
FLUOROSCOPY TIME:  Fluoroscopy Time:  1.1 minute
Radiation Exposure Index (if provided by the fluoroscopic device): 2
mGy
Number of Acquired Spot Images: 0

[Series 1: run · 1 of 28 frames shown (1 of 9)]
[frame 5/28]
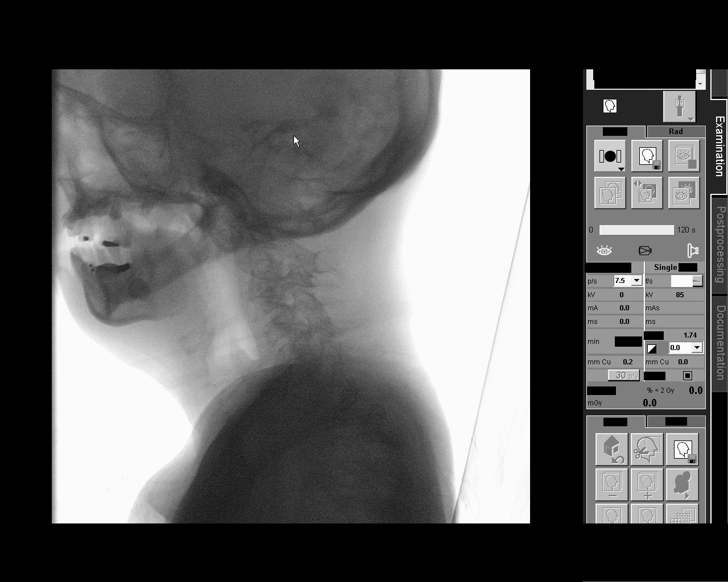

[Series 2: run · 2 of 130 frames shown (2 of 9)]
[frame 20/130]
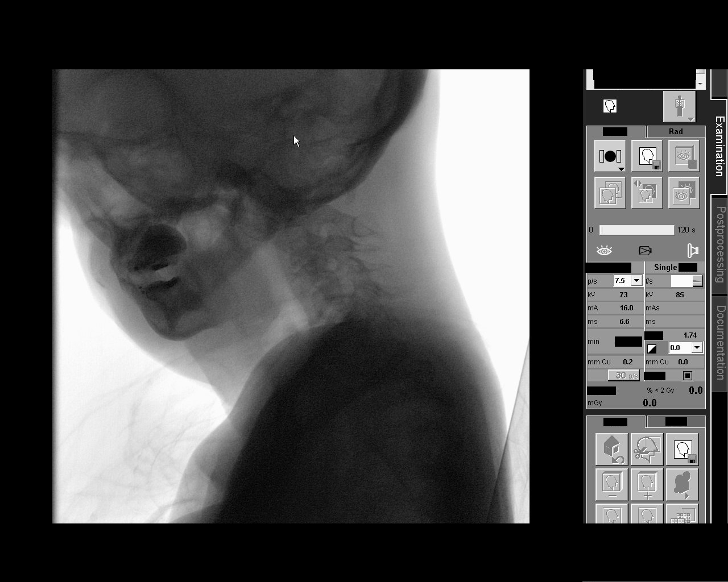
[frame 111/130]
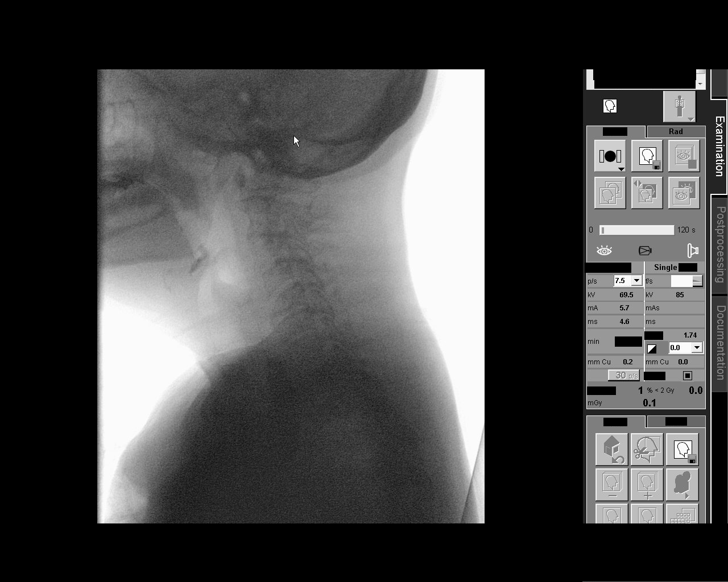

[Series 3: run · 1 of 51 frames shown (3 of 9)]
[frame 26/51]
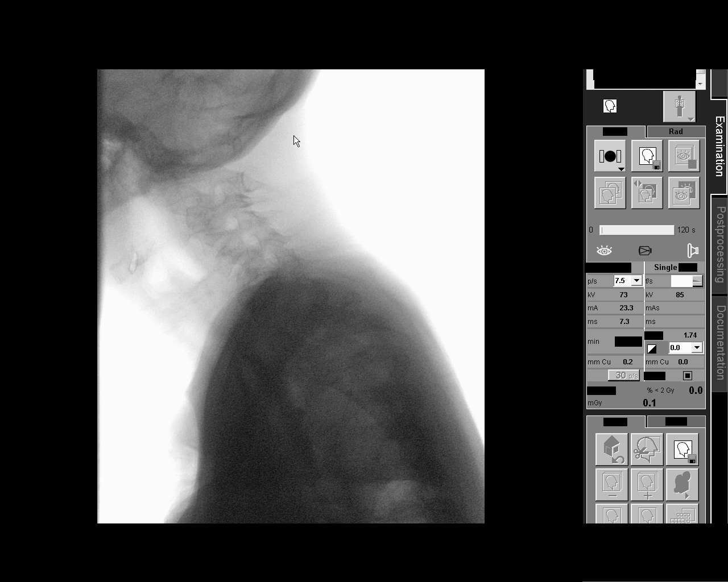

[Series 4: run · 1 of 198 frames shown (4 of 9)]
[frame 65/198]
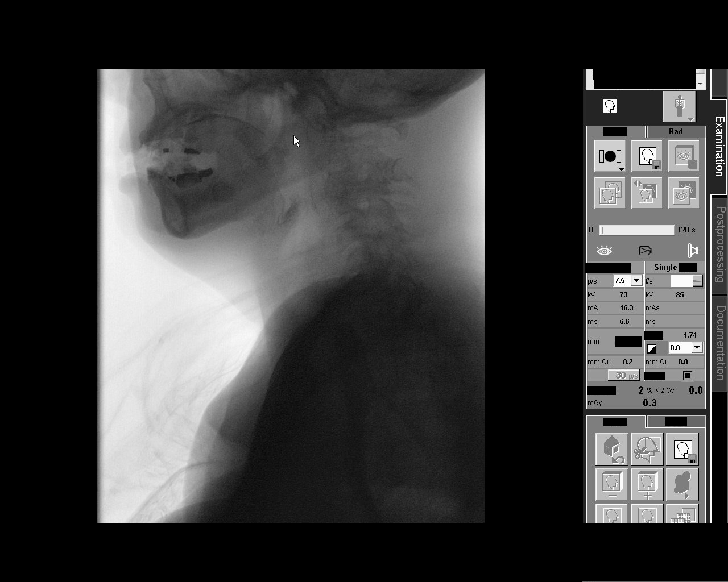

[Series 5: run · 2 of 144 frames shown (5 of 9)]
[frame 22/144]
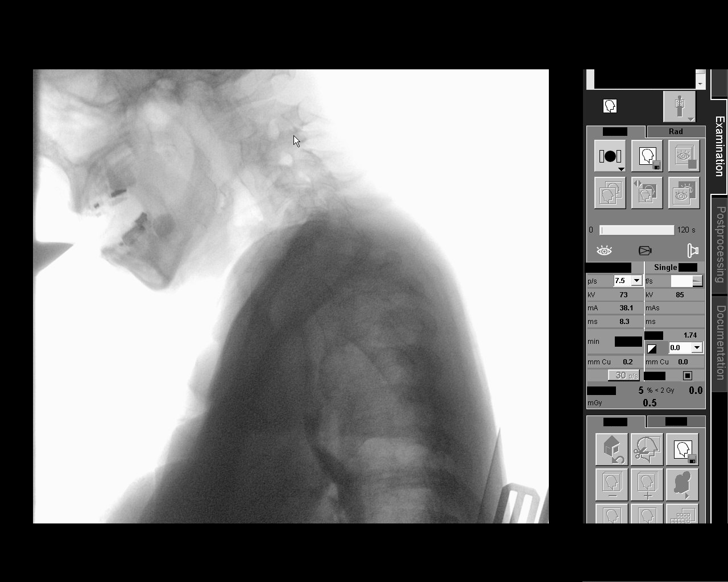
[frame 139/144]
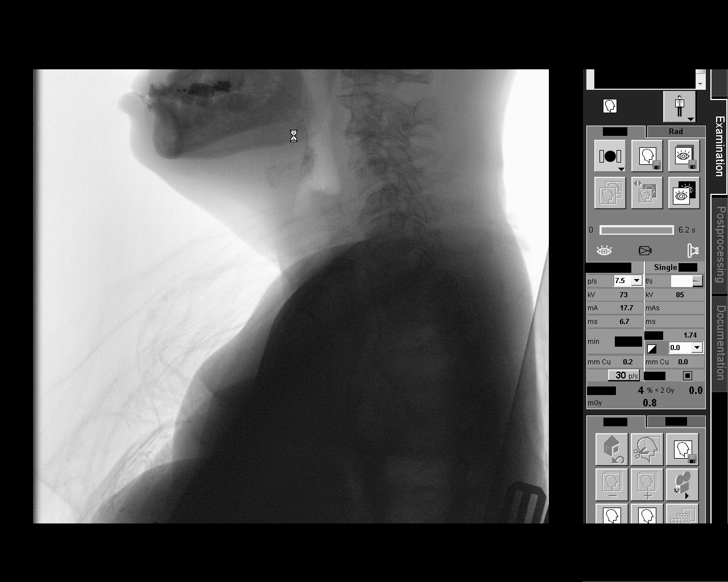

[Series 6: run · 2 of 23 frames shown (6 of 9)]
[frame 4/23]
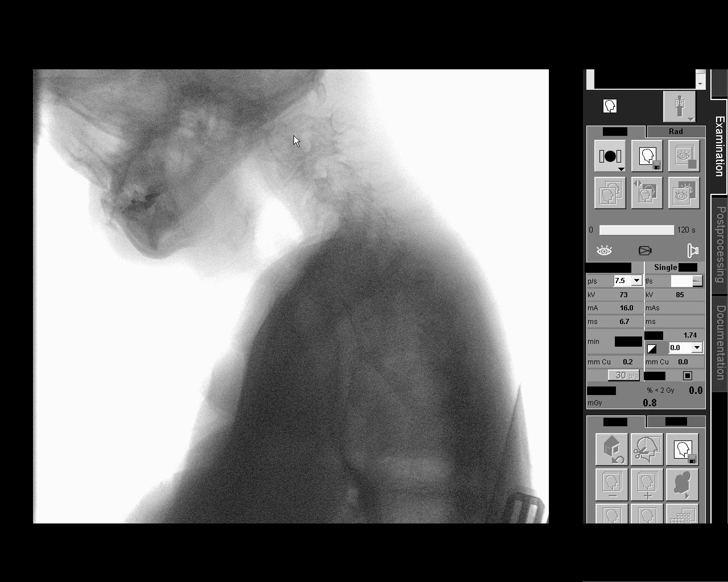
[frame 21/23]
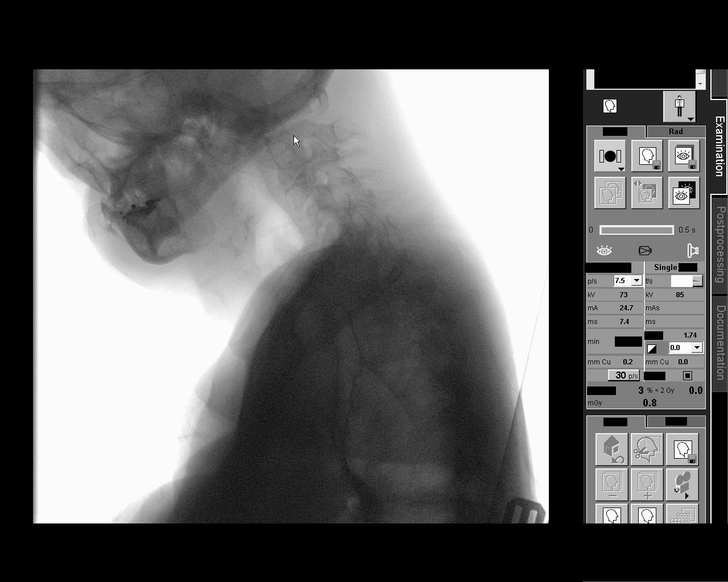

[Series 7: run · 1 of 182 frames shown (7 of 9)]
[frame 155/182]
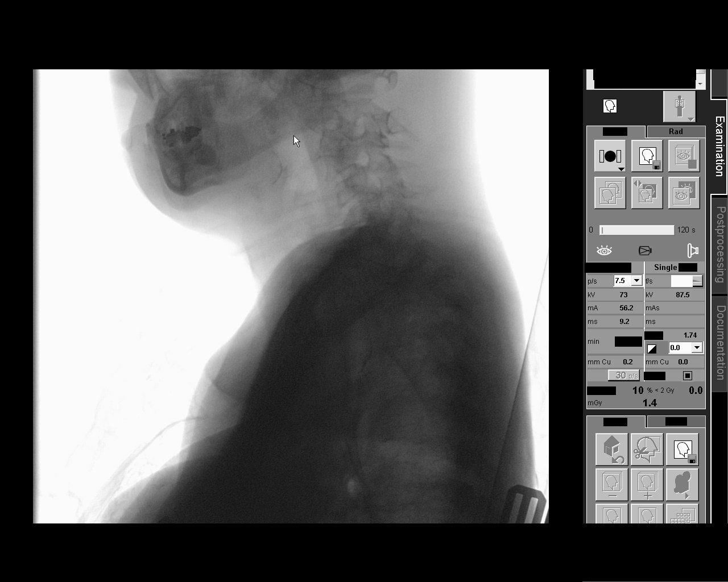

[Series 8: run · 1 of 345 frames shown (8 of 9)]
[frame 64/345]
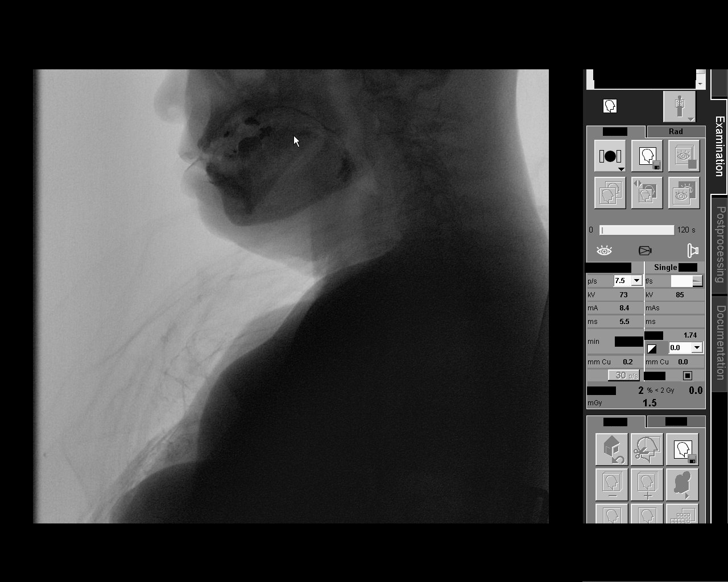

[Series 9: run · 2 of 885 frames shown (9 of 9)]
[frame 133/885]
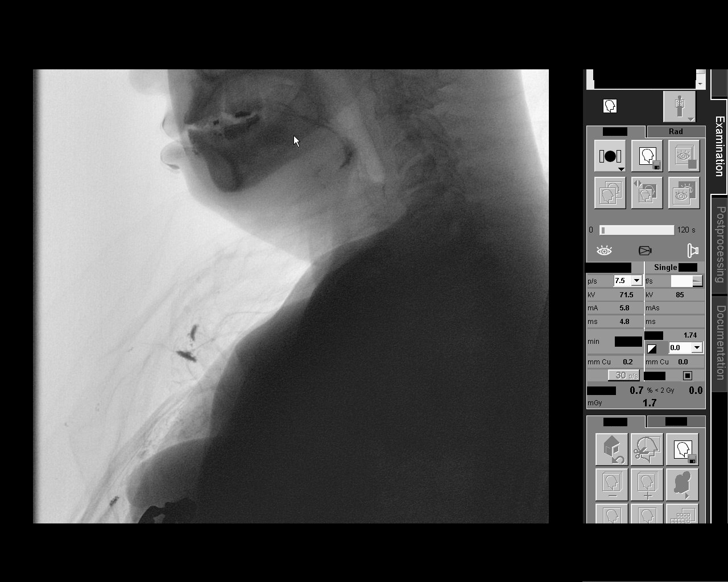
[frame 753/885]
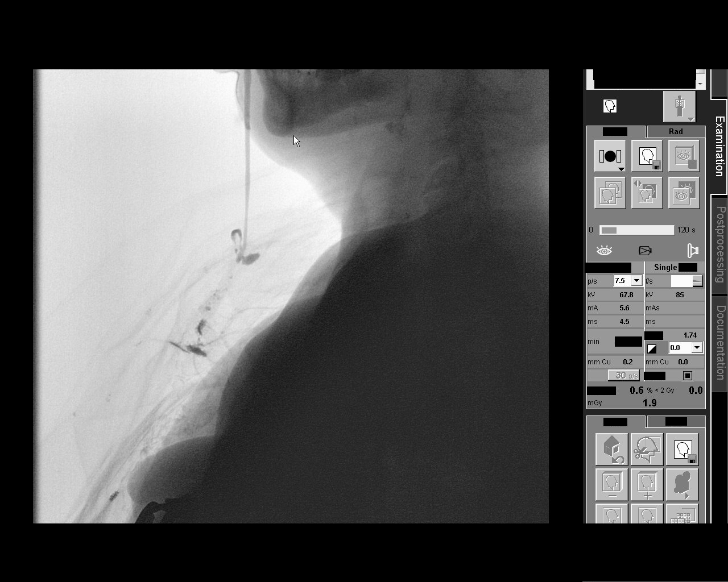

[13 of 24 positions shown; findings below may reference images not displayed]

FINDINGS: Thin liquid- within normal limits

Nectar thick liquid- within normal limits

Pure- within normal limits
IMPRESSION: Modified barium swallow as described above.

Please refer to the Speech Pathologists report for complete details
and recommendations.

## 2018-07-14 ENCOUNTER — Other Ambulatory Visit (HOSPITAL_COMMUNITY)
Admission: RE | Admit: 2018-07-14 | Discharge: 2018-07-14 | Disposition: A | Discharge status: Home / Self Care | Payer: Medicare Other | Source: Ambulatory Visit | Attending: Obstetrics and Gynecology | Admitting: Obstetrics and Gynecology

## 2018-07-14 ENCOUNTER — Ambulatory Visit (INDEPENDENT_AMBULATORY_CARE_PROVIDER_SITE_OTHER): Payer: Medicare Other | Admitting: Obstetrics and Gynecology

## 2018-07-14 VITALS — Ht <= 58 in | Wt 104.0 lb

## 2018-07-14 DIAGNOSIS — L89101 Pressure ulcer of unspecified part of back, stage 1: Secondary | ICD-10-CM | POA: Diagnosis not present

## 2018-07-14 DIAGNOSIS — B379 Candidiasis, unspecified: Secondary | ICD-10-CM | POA: Insufficient documentation

## 2018-07-14 MED ORDER — NYSTATIN-TRIAMCINOLONE 100000-0.1 UNIT/GM-% EX CREA: 1.0000 "application " | TOPICAL_CREAM | Freq: Two times a day (BID) | CUTANEOUS | 0 refills | Status: AC

## 2018-07-14 NOTE — Addendum Note (Signed)
Addended by: Marchelle Folks on: 07/14/2018 04:07 PM   Modules accepted: Orders

## 2018-07-14 NOTE — Progress Notes (Signed)
Chief complaint: 1.  Monilia infection  Patient is a 52 year old white female, para 0, with trisomy 77, who presents for evaluation of suspected monilia infection. No history of abnormal uterine bleeding over the past year.  Patient is not taking any HRT therapy.  Previously she was taking Provera. No history of STDs. No history of vaginal discharge or vaginal bleeding. Patient is incontinent and does wear depends. No rectal bleeding has been identified with this [place] resident.  Patient has past history of bedsores and perianal inflammation which was treated with Desitin.  Significant interval history is notable for hospitalization for infection (abscess in head neck region) requiring IV antibiotics.  She also has been on other rounds of antibiotics as well for upper respiratory infection. Patient now has patchy erythematous skin rash occurring on right side of face, right shoulder, left thigh. She does have recurrent decubitus ulcers.  No significant vaginal discharge has been appreciated. Patient has been treated with 3 doses of Diflucan 100 mg daily.  Last mammogram 12/30/2016 BI-RADS 1 Last Pap smear 11/19/2016-negative Colonoscopy-never  Past Medical History:  Diagnosis Date  . Abnormal uterine bleeding (AUB)   . Anxiety   . Cleft lip   . Complication of anesthesia    pt was given 2 shots for sedation for a past surgery that made pt aggressive.  Mom went back with pt for last surgery, pt did well.  . Dementia   . Dementia   . Depression   . Down's syndrome   . Hypsarrhythmia (HCC)   . Irregular periods/menstrual cycles   . Seizures (HCC)    last episode 07/19/16.  2-3x/mo   Past Surgical History:  Procedure Laterality Date  . CERUMEN REMOVAL Bilateral 04/18/2015   Procedure: CERUMEN REMOVAL, Bilateral ;  Surgeon: Geanie Logan, MD;  Location: [facility] OR[facility];  Service: ENT;  Laterality: Bilateral;  . CERUMEN REMOVAL Bilateral 08/05/2016   Procedure: CERUMEN REMOVAL;  Surgeon:  Geanie Logan, MD;  Location: [facility];  Service: ENT;  Laterality: Bilateral;  . EAR EXAMINATION UNDER ANESTHESIA Bilateral    remove cerumen from ears  . ESOPHAGOGASTRODUODENOSCOPY (EGD) WITH PROPOFOL N/A 02/24/2018   Procedure: ESOPHAGOGASTRODUODENOSCOPY (EGD) WITH PROPOFOL;  Surgeon: Toledo, Boykin Nearing, MD;  Location: [facility];  Service: Gastroenterology;  Laterality: N/A;  . TOOTH EXTRACTION     ROS-not obtainable  OBJECTIVE: Ht 4\' 8"  (1.422 m)   Wt 104 lb (47.2 kg)   LMP  (LMP Unknown)   BMI 23.32 kg/m  Slightly anxious, somewhat sedated female in no acute distress Head: Erythematous patches consistent with possible monilia infection or allergic reaction on right side of face Neck: No significant adenopathy Skin: Erythematous scaly rash noted on right shoulder, left thigh, abdomen Breasts: Normal without palpable masses; no skin changes Abdomen: Soft, no peritoneal signs Pelvic: External genitalia-normal BUS-normal Vagina-clear secretions present; Nuswab sample obtained with Q-tip Cervix-not examined Uterus-not examined Adnexa-not examined Rectovaginal-external exam normal; 1 cm decubitus ulcer noted on left gluteus, not infected Extremities-warm and dry without edema    ASSESSMENT: 1.  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  Decubitus ulcer left gluteus 3.  Status post multiple rounds of antibiotics for infection of head and neck and upper respiratory tract  PLAN: 1.  Discontinue straight triamcinolone cream to the face 2.  Begin using Mycolog cream nystatin/triamcinolone topically to the erythematous scaly rash involving the face, neck, shoulder, abdomen, thigh 3.  Recommend using Desitin ointment topically to the perianal region and decubitus ulcer  region as a skin protectant; apply this after changing depends or undergarments 4.  If no improvement with skin rash over 14 days, recommend dermatology referral  A  total of 15 minutes were spent face-to-face with the patient during this encounter and over half of that time dealt with counseling and coordination of care.  Herold Harms, MD  Note: This dictation was prepared with Dragon dictation along with smaller phrase technology. Any transcriptional errors that result from this process are unintentional.

## 2018-07-14 NOTE — Patient Instructions (Addendum)
1.  Recommend Mycolog cream topically to the skin areas affected (patches of redness and scaling of skin), located on the face, shoulder, inner thigh/groin areas.  Apply medication twice daily for 2 weeks 2.  Apply Desitin ointment topically to the vulva and perianal region, particularly in the area of bedsores to protect skin, when changing depends or undergarments. 3.  If there is no improvement in the rash on the face, shoulder, thigh in 2 weeks, recommend dermatology referral 4.  Culture of the vagina is taken to assess for yeast infection and sensitivity.  Results will be made available.

## 2018-07-15 ENCOUNTER — Telehealth: Payer: Self-pay | Admitting: Obstetrics and Gynecology

## 2018-07-15 LAB — CERVICOVAGINAL ANCILLARY ONLY: Candida vaginitis: NEGATIVE

## 2018-07-15 NOTE — Telephone Encounter (Signed)
See lab results.  

## 2018-07-15 NOTE — Telephone Encounter (Signed)
The patient called and stated that she missed a call from [facility] and would like a call back if possible today. Please advise.  °

## 2018-07-26 ENCOUNTER — Other Ambulatory Visit: Payer: Self-pay

## 2018-07-26 MED ORDER — NYSTATIN 100000 UNIT/GM EX OINT: 1.0000 "application " | TOPICAL_OINTMENT | Freq: Two times a day (BID) | CUTANEOUS | 0 refills | Status: AC

## 2018-07-26 MED ORDER — TRIAMCINOLONE ACETONIDE 0.1 % EX OINT: 1.0000 "application " | TOPICAL_OINTMENT | Freq: Two times a day (BID) | CUTANEOUS | 0 refills | Status: AC

## 2018-08-11 ENCOUNTER — Other Ambulatory Visit: Payer: Self-pay

## 2018-08-11 MED ORDER — AMMONIUM LACTATE 12 % EX CREA: TOPICAL_CREAM | CUTANEOUS | 0 refills | Status: AC | PRN

## 2018-11-30 ENCOUNTER — Encounter: Payer: Self-pay | Admitting: Obstetrics and Gynecology

## 2018-11-30 ENCOUNTER — Encounter: Payer: Medicare Other | Admitting: Obstetrics and Gynecology

## 2018-11-30 ENCOUNTER — Ambulatory Visit (INDEPENDENT_AMBULATORY_CARE_PROVIDER_SITE_OTHER): Payer: Medicare Other | Admitting: Obstetrics and Gynecology

## 2018-11-30 VITALS — BP 107/68 | Ht <= 58 in | Wt 107.0 lb

## 2018-11-30 DIAGNOSIS — Z0000 Encounter for general adult medical examination without abnormal findings: Secondary | ICD-10-CM | POA: Diagnosis not present

## 2018-11-30 DIAGNOSIS — Z1231 Encounter for screening mammogram for malignant neoplasm of breast: Secondary | ICD-10-CM

## 2018-11-30 NOTE — Progress Notes (Signed)
HPI:      Ms. Montey HoraSusan Goehring is a 53 y.o. G0P0 who LMP was No LMP recorded (lmp unknown). Patient is postmenopausal.  Subjective:   She presents today for her well woman visit.  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  Reports primary caregivers reveal that she has not had abnormal vaginal bleeding vaginal discharge or any vulvar issues. She has never been sexually active. Her most recent Pap smear was done blindly 2 years ago and no abnormalities were noted.    Hx: The following portions of the patient's history were reviewed and updated as appropriate:             She  has a past medical history of Abnormal uterine bleeding (AUB), Anxiety, Cleft lip, Complication of anesthesia, Dementia (HCC), Dementia (HCC), Depression, Down's syndrome, Hypsarrhythmia (HCC), Irregular periods/menstrual cycles, and Seizures (HCC). She does not have any pertinent problems on file. She  has a past surgical history that includes Ear examination under anesthesia (Bilateral); Cerumen removal (Bilateral, 04/18/2015); Tooth extraction; Cerumen removal (Bilateral, 08/05/2016); and Esophagogastroduodenoscopy (egd) with propofol (N/A, 02/24/2018). Her family history is not on file. She  reports that she has never smoked. She has never used smokeless tobacco. She reports that she does not drink alcohol or use drugs. She has a current medication list which includes the following prescription(s): aluminum-magnesium hydroxide-simethicone, ammonium lactate, ammonium lactate, calamine, calcium carbonate, calcium-vitamin d, divalproex, escitalopram, hydrocerin, hydrocortisone, hydrogen peroxide, levetiracetam, levetiracetam, loperamide, magnesium hydroxide, memantine, mirtazapine, multivitamin with minerals, nystatin ointment, nystatin-triamcinolone, polymixin-bacitracin, pseudoeph-chlorphen-dm, pseudoephedrine, rivastigmine, senna, sunscreens, talc, loratadine, trazodone, triamcinolone ointment, triamcinolone ointment, and  vimpat. She is allergic to lamotrigine.       Review of Systems:  Review of Systems  Constitutional: Denied constitutional symptoms, night sweats, recent illness, fatigue, fever, insomnia and weight loss.  Eyes: Denied eye symptoms, eye pain, photophobia, vision change and visual disturbance.  Ears/Nose/Throat/Neck: Denied ear, nose, throat or neck symptoms, hearing loss, nasal discharge, sinus congestion and sore throat.  Cardiovascular: Denied cardiovascular symptoms, arrhythmia, chest pain/pressure, edema, exercise intolerance, orthopnea and palpitations.  Respiratory: Denied pulmonary symptoms, asthma, pleuritic pain, productive sputum, cough, dyspnea and wheezing.  Gastrointestinal: Denied, gastro-esophageal reflux, melena, nausea and vomiting.  Genitourinary: Denied genitourinary symptoms including symptomatic vaginal discharge, pelvic relaxation issues, and urinary complaints.  Musculoskeletal: Denied musculoskeletal symptoms, stiffness, swelling, muscle weakness and myalgia.  Dermatologic: Denied dermatology symptoms, rash and scar.  Neurologic: Denied neurology symptoms, dizziness, headache, neck pain and syncope.  Psychiatric: Denied psychiatric symptoms, anxiety and depression.  Endocrine: Denied endocrine symptoms including hot flashes and night sweats.   Meds:   Current Outpatient Medications on File Prior to Visit  Medication Sig Dispense Refill  . aluminum-magnesium hydroxide-simethicone (MAALOX) 200-200-20 MG/5ML SUSP Take 30 mLs by mouth every 4 (four) hours as needed (upset stomach).    Marland Kitchen. ammonium lactate (AMLACTIN) 12 % cream Apply topically 2 (two) times daily as needed for dry skin.     Marland Kitchen. ammonium lactate (AMLACTIN) 12 % cream Apply topically as needed for dry skin. 385 g 0  . calamine lotion Apply 1 application topically as needed for itching.    . calcium carbonate (OS-CAL - DOSED IN MG OF ELEMENTAL CALCIUM) 1250 (500 Ca) MG tablet Take 1 tablet by mouth.    .  Calcium Carbonate-Vitamin D (CALCIUM-VITAMIN D) 500-200 MG-UNIT per tablet Take 1 tablet by mouth 2 (two) times daily.    . divalproex (DEPAKOTE SPRINKLE) 125 MG capsule     . escitalopram (  LEXAPRO) 10 MG tablet Take 10 mg by mouth 2 (two) times daily.    . hydrocerin (EUCERIN) CREA Apply 1 application topically at bedtime.    . hydrocortisone 2.5 % cream Apply topically 2 (two) times daily.    . hydrogen peroxide 1.5 % SOLN Place 4 drops into both ears See admin instructions. 2 times a month    . levETIRAcetam (KEPPRA) 500 MG tablet Take 3 tablets (1,500 mg total) by mouth 2 (two) times daily. 180 tablet 2  . levETIRAcetam (KEPPRA) 750 MG tablet     . loperamide (IMODIUM) 2 MG capsule Take 4 mg by mouth.    . magnesium hydroxide (MILK OF MAGNESIA) 400 MG/5ML suspension Take 30 mLs by mouth every three (3) days as needed for mild constipation or moderate constipation.     . memantine (NAMENDA XR) 28 MG CP24 24 hr capsule Take 28 mg by mouth at bedtime.    . mirtazapine (REMERON) 15 MG tablet Take 15 mg by mouth at bedtime.    . Multiple Vitamin (MULTIVITAMIN WITH MINERALS) TABS tablet Take 1 tablet by mouth daily.    Marland Kitchen nystatin ointment (MYCOSTATIN) Apply 1 application topically 2 (two) times daily. 30 g 0  . nystatin-triamcinolone (MYCOLOG II) cream Apply 1 application topically 2 (two) times daily. For 10-14 days 60 g 0  . polymixin-bacitracin (POLYSPORIN) 500-10000 UNIT/GM OINT ointment Apply 1 application topically as needed. For minor abrasions, scratches, or excoriated areas    . Pseudoeph-Chlorphen-DM (KIDKARE COUGH/COLD) 15-1-5 MG/5ML LIQD Take 10 mLs by mouth every 6 (six) hours as needed. For cold symptoms    . pseudoephedrine (SUDAFED) 120 MG 12 hr tablet Take by mouth.    . rivastigmine (EXELON) 9.5 mg/24hr Place 9.5 mg onto the skin daily.    Marland Kitchen senna (SENOKOT) 8.6 MG tablet Take by mouth.    . SUNSCREEN SPF30 EX Apply topically as needed. Prior to sun exposure    . Talc (BABY POWDER  EX) Apply 1 application topically daily as needed (for moisture protection).    Marland Kitchen loratadine (CLARITIN) 10 MG tablet Take by mouth.    . traZODone (DESYREL) 50 MG tablet Take 100 mg by mouth at bedtime as needed for sleep. Between 12 am-2 am     . triamcinolone ointment (KENALOG) 0.1 % Apply 1 application topically 2 (two) times daily as needed (for red, itchy skin).     Marland Kitchen triamcinolone ointment (KENALOG) 0.1 % Apply 1 application topically 2 (two) times daily. 30 g 0  . VIMPAT 150 MG TABS 150 mg 2 (two) times daily.      No current facility-administered medications on file prior to visit.     Objective:     Vitals:   11/30/18 1403  BP: 107/68              Physical examination   Pelvic:  Vulva: Normal appearance.  No lesions.  Vagina: No lesions or abnormalities noted.  Support: Normal pelvic support.  Urethra No masses tenderness or scarring.  Meatus Normal size without lesions or prolapse.  Cervix:  Not visualized  Anus: Normal exam.  No lesions.  Perineum: Normal exam.  No lesions.   Very small introitus with small vaginal opening.  This is consistent with her virginal status Excellent perineal care without erythema or irritation.  Assessment:    G0P0 Patient Active Problem List   Diagnosis Date Noted  . Yeast infection 07/14/2018  . Pressure injury of back, stage 1 07/14/2018  . Menopause  11/25/2017  . Aspiration into airway 05/04/2017  . Ataxia 04/29/2017  . Mild protein-calorie malnutrition (HCC) 01/14/2017  . Mass of upper outer quadrant of right breast 11/19/2016  . Difficulty in walking 11/23/2014  . Seizure (HCC) 11/23/2014  . Disordered sleep 11/23/2014  . 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 AD (Alzheimer's disease) (HCC) 04/05/2014  . 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 Trisomy 21 06/14/2012     1. Encounter for screening mammogram for breast cancer   2. Encounter for annual physical exam     Vulvar/vaginal examination normal.  Because the  patient has never been sexually active I do not believe that Pap smears are warranted.  A yearly annual vulvar examination can be performed to check abnormality but her very low risk for cervical cancer balanced against the discomfort and concern the patient experiences seems to obviate the necessity of Pap smear screening.   Plan:            1.  Annual vulvar screening.  Pap smear not medically necessary. Orders Orders Placed This Encounter  Procedures  . MM 3D SCREEN BREAST BILATERAL    No orders of the defined types were placed in this encounter.     F/U  No follow-ups on file.  Elonda Husky, M.D. 11/30/2018 2:35 PM

## 2018-11-30 NOTE — Progress Notes (Signed)
Patient comes in today for her yearly physical. No problems or concerns today. Mammogram ordered.

## 2018-12-27 ENCOUNTER — Emergency Department: Payer: Medicare Other

## 2018-12-27 ENCOUNTER — Encounter: Payer: Self-pay | Admitting: Emergency Medicine

## 2018-12-27 ENCOUNTER — Inpatient Hospital Stay
Admission: EM | Admit: 2018-12-27 | Discharge: 2019-01-09 | DRG: 193 | Disposition: E | Payer: Medicare Other | Attending: Internal Medicine | Admitting: Internal Medicine

## 2018-12-27 ENCOUNTER — Other Ambulatory Visit: Payer: Self-pay

## 2018-12-27 DIAGNOSIS — G9341 Metabolic encephalopathy: Secondary | ICD-10-CM | POA: Diagnosis present

## 2018-12-27 DIAGNOSIS — N179 Acute kidney failure, unspecified: Secondary | ICD-10-CM | POA: Diagnosis present

## 2018-12-27 DIAGNOSIS — Z79899 Other long term (current) drug therapy: Secondary | ICD-10-CM

## 2018-12-27 DIAGNOSIS — J1000 Influenza due to other identified influenza virus with unspecified type of pneumonia: Principal | ICD-10-CM | POA: Diagnosis present

## 2018-12-27 DIAGNOSIS — F0280 Dementia in other diseases classified elsewhere without behavioral disturbance: Secondary | ICD-10-CM | POA: Diagnosis present

## 2018-12-27 DIAGNOSIS — J101 Influenza due to other identified influenza virus with other respiratory manifestations: Secondary | ICD-10-CM

## 2018-12-27 DIAGNOSIS — R0602 Shortness of breath: Secondary | ICD-10-CM

## 2018-12-27 DIAGNOSIS — A4189 Other specified sepsis: Secondary | ICD-10-CM | POA: Diagnosis not present

## 2018-12-27 DIAGNOSIS — Q909 Down syndrome, unspecified: Secondary | ICD-10-CM | POA: Diagnosis not present

## 2018-12-27 DIAGNOSIS — F329 Major depressive disorder, single episode, unspecified: Secondary | ICD-10-CM | POA: Diagnosis present

## 2018-12-27 DIAGNOSIS — Z515 Encounter for palliative care: Secondary | ICD-10-CM | POA: Diagnosis not present

## 2018-12-27 DIAGNOSIS — Z66 Do not resuscitate: Secondary | ICD-10-CM | POA: Diagnosis not present

## 2018-12-27 DIAGNOSIS — J9600 Acute respiratory failure, unspecified whether with hypoxia or hypercapnia: Secondary | ICD-10-CM

## 2018-12-27 DIAGNOSIS — J1100 Influenza due to unidentified influenza virus with unspecified type of pneumonia: Secondary | ICD-10-CM | POA: Diagnosis not present

## 2018-12-27 DIAGNOSIS — Z7189 Other specified counseling: Secondary | ICD-10-CM | POA: Diagnosis not present

## 2018-12-27 DIAGNOSIS — F419 Anxiety disorder, unspecified: Secondary | ICD-10-CM | POA: Diagnosis present

## 2018-12-27 DIAGNOSIS — R059 Cough, unspecified: Secondary | ICD-10-CM

## 2018-12-27 DIAGNOSIS — G309 Alzheimer's disease, unspecified: Secondary | ICD-10-CM | POA: Diagnosis present

## 2018-12-27 DIAGNOSIS — R6521 Severe sepsis with septic shock: Secondary | ICD-10-CM | POA: Diagnosis not present

## 2018-12-27 DIAGNOSIS — G40909 Epilepsy, unspecified, not intractable, without status epilepticus: Secondary | ICD-10-CM | POA: Diagnosis present

## 2018-12-27 DIAGNOSIS — J90 Pleural effusion, not elsewhere classified: Secondary | ICD-10-CM | POA: Diagnosis not present

## 2018-12-27 DIAGNOSIS — F840 Autistic disorder: Secondary | ICD-10-CM | POA: Diagnosis present

## 2018-12-27 DIAGNOSIS — J9601 Acute respiratory failure with hypoxia: Secondary | ICD-10-CM

## 2018-12-27 DIAGNOSIS — R05 Cough: Secondary | ICD-10-CM

## 2018-12-27 DIAGNOSIS — J189 Pneumonia, unspecified organism: Secondary | ICD-10-CM

## 2018-12-27 DIAGNOSIS — R0603 Acute respiratory distress: Secondary | ICD-10-CM

## 2018-12-27 DIAGNOSIS — Z888 Allergy status to other drugs, medicaments and biological substances status: Secondary | ICD-10-CM | POA: Diagnosis not present

## 2018-12-27 DIAGNOSIS — J9690 Respiratory failure, unspecified, unspecified whether with hypoxia or hypercapnia: Secondary | ICD-10-CM | POA: Diagnosis present

## 2018-12-27 DIAGNOSIS — Z79891 Long term (current) use of opiate analgesic: Secondary | ICD-10-CM | POA: Diagnosis not present

## 2018-12-27 DIAGNOSIS — R451 Restlessness and agitation: Secondary | ICD-10-CM | POA: Diagnosis not present

## 2018-12-27 LAB — COMPREHENSIVE METABOLIC PANEL
ALT: 9 U/L (ref 0–44)
AST: 30 U/L (ref 15–41)
Albumin: 2.8 g/dL — ABNORMAL LOW (ref 3.5–5.0)
Alkaline Phosphatase: 104 U/L (ref 38–126)
Anion gap: 7 (ref 5–15)
BILIRUBIN TOTAL: 0.3 mg/dL (ref 0.3–1.2)
BUN: 14 mg/dL (ref 6–20)
CO2: 31 mmol/L (ref 22–32)
Calcium: 8.4 mg/dL — ABNORMAL LOW (ref 8.9–10.3)
Chloride: 100 mmol/L (ref 98–111)
Creatinine, Ser: 0.87 mg/dL (ref 0.44–1.00)
GFR calc Af Amer: >60 mL/min (ref >60)
GFR calc non Af Amer: >60 mL/min (ref >60)
Glucose, Bld: 83 mg/dL (ref 70–99)
Potassium: 4.3 mmol/L (ref 3.5–5.1)
Sodium: 138 mmol/L (ref 135–145)
TOTAL PROTEIN: 7 g/dL (ref 6.5–8.1)

## 2018-12-27 LAB — CBC WITH DIFFERENTIAL/PLATELET
Abs Immature Granulocytes: 0.04 10*3/uL (ref 0.00–0.07)
Basophils Absolute: 0.1 10*3/uL (ref 0.0–0.1)
Basophils Relative: 1 %
Eosinophils Absolute: 0.1 10*3/uL (ref 0.0–0.5)
Eosinophils Relative: 1 %
HEMATOCRIT: 39.7 % (ref 36.0–46.0)
Hemoglobin: 12.9 g/dL (ref 12.0–15.0)
Immature Granulocytes: 0 %
Lymphocytes Relative: 8 %
Lymphs Abs: 0.8 10*3/uL (ref 0.7–4.0)
MCH: 34.8 pg — ABNORMAL HIGH (ref 26.0–34.0)
MCHC: 32.5 g/dL (ref 30.0–36.0)
MCV: 107 fL — ABNORMAL HIGH (ref 80.0–100.0)
MONOS PCT: 4 %
Monocytes Absolute: 0.4 10*3/uL (ref 0.1–1.0)
Neutro Abs: 8.7 10*3/uL — ABNORMAL HIGH (ref 1.7–7.7)
Neutrophils Relative %: 86 %
Platelets: 229 10*3/uL (ref 150–400)
RBC: 3.71 MIL/uL — ABNORMAL LOW (ref 3.87–5.11)
RDW: 14.1 % (ref 11.5–15.5)
WBC: 10.1 10*3/uL (ref 4.0–10.5)
nRBC: 0 % (ref 0.0–0.2)

## 2018-12-27 LAB — LACTIC ACID, PLASMA: Lactic Acid, Venous: 1 mmol/L (ref 0.5–1.9)

## 2018-12-27 LAB — PROCALCITONIN: Procalcitonin: 0.56 ng/mL

## 2018-12-27 LAB — INFLUENZA PANEL BY PCR (TYPE A & B)
INFLBPCR: NEGATIVE
Influenza A By PCR: POSITIVE — AB

## 2018-12-27 LAB — TROPONIN I: Troponin I: <0.03 ng/mL (ref <0.03)

## 2018-12-27 MED ORDER — VANCOMYCIN HCL IN DEXTROSE 750-5 MG/150ML-% IV SOLN
750.0000 mg | INTRAVENOUS | Status: DC
  Filled 2018-12-27: qty 150

## 2018-12-27 MED ORDER — PIPERACILLIN-TAZOBACTAM 3.375 G IVPB
3.3750 g | Freq: Three times a day (TID) | INTRAVENOUS | Status: DC
  Administered 2018-12-27 – 2018-12-28 (×4): 3.375 g via INTRAVENOUS
  Filled 2018-12-27 (×4): qty 50

## 2018-12-27 MED ORDER — IPRATROPIUM-ALBUTEROL 0.5-2.5 (3) MG/3ML IN SOLN: 3.0000 mL | RESPIRATORY_TRACT | Status: DC | PRN

## 2018-12-27 MED ORDER — RIVASTIGMINE 9.5 MG/24HR TD PT24
9.5000 mg | MEDICATED_PATCH | Freq: Every day | TRANSDERMAL | Status: DC
  Administered 2018-12-27 – 2018-12-30 (×4): 9.5 mg via TRANSDERMAL
  Filled 2018-12-27 (×7): qty 1

## 2018-12-27 MED ORDER — LOPERAMIDE HCL 2 MG PO CAPS: 4.0000 mg | ORAL_CAPSULE | ORAL | Status: DC | PRN

## 2018-12-27 MED ORDER — HYDRALAZINE HCL 20 MG/ML IJ SOLN: 10.0000 mg | INTRAMUSCULAR | Status: DC | PRN

## 2018-12-27 MED ORDER — OSELTAMIVIR PHOSPHATE 6 MG/ML PO SUSR
75.0000 mg | Freq: Once | ORAL | Status: AC
  Administered 2018-12-27: 75 mg via ORAL
  Filled 2018-12-27: qty 12.5

## 2018-12-27 MED ORDER — IPRATROPIUM-ALBUTEROL 0.5-2.5 (3) MG/3ML IN SOLN
3.0000 mL | Freq: Once | RESPIRATORY_TRACT | Status: AC
  Administered 2018-12-27: 3 mL via RESPIRATORY_TRACT
  Filled 2018-12-27: qty 6

## 2018-12-27 MED ORDER — FLUCONAZOLE 50 MG PO TABS
150.0000 mg | ORAL_TABLET | Freq: Once | ORAL | Status: AC
  Administered 2018-12-29: 150 mg via ORAL
  Filled 2018-12-27 (×3): qty 1

## 2018-12-27 MED ORDER — OSELTAMIVIR PHOSPHATE 75 MG PO CAPS: 75.0000 mg | ORAL_CAPSULE | Freq: Once | ORAL | Status: DC

## 2018-12-27 MED ORDER — ESCITALOPRAM OXALATE 10 MG PO TABS
10.0000 mg | ORAL_TABLET | Freq: Two times a day (BID) | ORAL | Status: DC
  Administered 2018-12-27 – 2018-12-30 (×7): 10 mg via ORAL
  Filled 2018-12-27 (×10): qty 1

## 2018-12-27 MED ORDER — SODIUM CHLORIDE 0.9 % IV SOLN
1.0000 g | INTRAVENOUS | Status: DC
  Filled 2018-12-27: qty 10

## 2018-12-27 MED ORDER — ALUM & MAG HYDROXIDE-SIMETH 200-200-20 MG/5ML PO SUSP: 30.0000 mL | ORAL | Status: DC | PRN

## 2018-12-27 MED ORDER — CALCIUM CARBONATE ANTACID 500 MG PO CHEW
2.5000 | CHEWABLE_TABLET | Freq: Every day | ORAL | Status: DC
  Administered 2018-12-29: 500 mg via ORAL
  Filled 2018-12-27 (×3): qty 3

## 2018-12-27 MED ORDER — SODIUM CHLORIDE 0.9 % IV SOLN
500.0000 mg | INTRAVENOUS | Status: DC
  Administered 2018-12-27: 500 mg via INTRAVENOUS
  Filled 2018-12-27: qty 500

## 2018-12-27 MED ORDER — SODIUM CHLORIDE 0.9 % IV BOLUS
1000.0000 mL | Freq: Once | INTRAVENOUS | Status: AC
  Administered 2018-12-27: 1000 mL via INTRAVENOUS

## 2018-12-27 MED ORDER — SODIUM CHLORIDE 0.9 % IV SOLN
1.0000 g | Freq: Once | INTRAVENOUS | Status: AC
  Administered 2018-12-27: 1 g via INTRAVENOUS
  Filled 2018-12-27: qty 10

## 2018-12-27 MED ORDER — CLONAZEPAM 0.5 MG PO TABS
0.5000 mg | ORAL_TABLET | Freq: Three times a day (TID) | ORAL | Status: DC | PRN
  Administered 2018-12-27 – 2018-12-30 (×5): 0.5 mg via ORAL
  Filled 2018-12-27 (×6): qty 1

## 2018-12-27 MED ORDER — ENOXAPARIN SODIUM 40 MG/0.4ML ~~LOC~~ SOLN
40.0000 mg | SUBCUTANEOUS | Status: DC
  Administered 2018-12-27 – 2018-12-29 (×2): 40 mg via SUBCUTANEOUS
  Filled 2018-12-27 (×3): qty 0.4

## 2018-12-27 MED ORDER — ACETAMINOPHEN 160 MG/5ML PO SOLN
650.0000 mg | Freq: Four times a day (QID) | ORAL | Status: DC | PRN
  Administered 2018-12-27 – 2018-12-28 (×2): 650 mg via ORAL
  Filled 2018-12-27 (×2): qty 20.3

## 2018-12-27 MED ORDER — LEVETIRACETAM 100 MG/ML PO SOLN
1500.0000 mg | Freq: Two times a day (BID) | ORAL | Status: DC
  Administered 2018-12-27 – 2018-12-28 (×2): 1500 mg via ORAL
  Filled 2018-12-27 (×5): qty 15

## 2018-12-27 MED ORDER — TRAZODONE HCL 100 MG PO TABS
100.0000 mg | ORAL_TABLET | Freq: Every evening | ORAL | Status: DC | PRN
  Administered 2018-12-27: 21:00:00 100 mg via ORAL
  Filled 2018-12-27: qty 2
  Filled 2018-12-27: qty 1

## 2018-12-27 MED ORDER — MIRTAZAPINE 15 MG PO TABS
15.0000 mg | ORAL_TABLET | Freq: Every day | ORAL | Status: DC
  Administered 2018-12-27: 15 mg via ORAL
  Filled 2018-12-27: qty 1

## 2018-12-27 MED ORDER — VANCOMYCIN HCL 10 G IV SOLR
1250.0000 mg | Freq: Once | INTRAVENOUS | Status: AC
  Administered 2018-12-27: 1250 mg via INTRAVENOUS
  Filled 2018-12-27: qty 1250

## 2018-12-27 MED ORDER — DIVALPROEX SODIUM 125 MG PO CSDR
125.0000 mg | DELAYED_RELEASE_CAPSULE | Freq: Two times a day (BID) | ORAL | Status: DC
  Administered 2018-12-27 – 2018-12-28 (×2): 125 mg via ORAL
  Filled 2018-12-27 (×3): qty 1

## 2018-12-27 MED ORDER — AMMONIUM LACTATE 12 % EX CREA: TOPICAL_CREAM | CUTANEOUS | Status: DC | PRN

## 2018-12-27 MED ORDER — GUAIFENESIN ER 600 MG PO TB12
600.0000 mg | ORAL_TABLET | Freq: Two times a day (BID) | ORAL | Status: DC
  Administered 2018-12-27 – 2018-12-28 (×2): 600 mg via ORAL
  Filled 2018-12-27 (×3): qty 1

## 2018-12-27 MED ORDER — MEMANTINE HCL ER 28 MG PO CP24
28.0000 mg | ORAL_CAPSULE | Freq: Every day | ORAL | Status: DC
  Administered 2018-12-27 – 2018-12-29 (×4): 28 mg via ORAL
  Filled 2018-12-27 (×4): qty 1

## 2018-12-27 MED ORDER — MAGNESIUM HYDROXIDE 400 MG/5ML PO SUSP
30.0000 mL | ORAL | Status: DC | PRN
  Filled 2018-12-27: qty 30

## 2018-12-27 MED ORDER — OSELTAMIVIR PHOSPHATE 30 MG PO CAPS
30.0000 mg | ORAL_CAPSULE | Freq: Two times a day (BID) | ORAL | Status: DC
  Administered 2018-12-27 – 2018-12-30 (×6): 30 mg via ORAL
  Filled 2018-12-27 (×6): qty 1

## 2018-12-27 MED ORDER — IPRATROPIUM-ALBUTEROL 0.5-2.5 (3) MG/3ML IN SOLN
3.0000 mL | Freq: Once | RESPIRATORY_TRACT | Status: AC
  Administered 2018-12-27: 3 mL via RESPIRATORY_TRACT

## 2018-12-27 MED ORDER — NYSTATIN 100000 UNIT/GM EX OINT
1.0000 "application " | TOPICAL_OINTMENT | Freq: Two times a day (BID) | CUTANEOUS | Status: DC
  Administered 2018-12-29 – 2018-12-30 (×3): 1 via TOPICAL
  Filled 2018-12-27 (×2): qty 15

## 2018-12-27 MED ORDER — LEVETIRACETAM 500 MG PO TABS
1500.0000 mg | ORAL_TABLET | Freq: Two times a day (BID) | ORAL | Status: DC
  Filled 2018-12-27: qty 3

## 2018-12-27 MED ORDER — AMMONIUM LACTATE 12 % EX LOTN
1.0000 "application " | TOPICAL_LOTION | Freq: Two times a day (BID) | CUTANEOUS | Status: DC | PRN
  Filled 2018-12-27: qty 400

## 2018-12-27 MED ORDER — SENNA 8.6 MG PO TABS
2.0000 | ORAL_TABLET | Freq: Every day | ORAL | Status: DC
  Administered 2018-12-27 – 2018-12-29 (×2): 17.2 mg via ORAL
  Filled 2018-12-27 (×2): qty 2

## 2018-12-27 MED ORDER — LORATADINE 10 MG PO TABS
10.0000 mg | ORAL_TABLET | Freq: Every day | ORAL | Status: DC
  Administered 2018-12-27 – 2018-12-28 (×2): 10 mg via ORAL
  Filled 2018-12-27 (×2): qty 1

## 2018-12-27 MED ORDER — VANCOMYCIN HCL IN DEXTROSE 750-5 MG/150ML-% IV SOLN: 750.0000 mg | Freq: Once | INTRAVENOUS | Status: DC

## 2018-12-27 MED ORDER — PIPERACILLIN-TAZOBACTAM 3.375 G IVPB 30 MIN: 3.3750 g | Freq: Three times a day (TID) | INTRAVENOUS | Status: DC

## 2018-12-27 MED ORDER — LACOSAMIDE 50 MG PO TABS
150.0000 mg | ORAL_TABLET | Freq: Two times a day (BID) | ORAL | Status: DC
  Administered 2018-12-27 – 2018-12-28 (×2): 150 mg via ORAL
  Filled 2018-12-27 (×2): qty 3

## 2018-12-27 MED ORDER — SODIUM CHLORIDE 0.9 % IV SOLN
500.0000 mg | Freq: Once | INTRAVENOUS | Status: AC
  Filled 2018-12-27: qty 500

## 2018-12-27 NOTE — Progress Notes (Signed)
Family Meeting Note  Advance Directive:yes  Today a meeting took place with the Patient and siblings.  Patient is unable to participate due UX:NATFTD capacity dementia   The following clinical team members were present during this meeting:MD  The following were discussed:Patient's diagnosis:cap, dementia , Patient's progosis: Unable to determine and Goals for treatment: Full Code  Additional follow-up to be provided: prn  Time spent during discussion:20 minutes  Bertrum Sol, MD

## 2018-12-27 NOTE — ED Notes (Signed)
ED TO INPATIENT HANDOFF REPORT  Name/Age/Gender Hannah Gardner 53 y.o. female  Code Status   Home/SNF/Other Boarding Home   Chief Complaint brought by kc low sat  Level of Care/Admitting Diagnosis ED Disposition    ED Disposition Condition Comment   Admit  [facility] [[id number]]  Level of Care: Med-Surg [16]  Diagnosis: Respiratory failure [facility](HCC) [[id number]]  Admitting Physician: Bertrum SolSALARY, MONTELL D [[id number]]  Attending Physician: Bertrum SolSALARY, MONTELL D [[id number]]  Estimated length of stay: past midnight tomorrow  Certification:: I certify this patient will need inpatient services for at least 2 midnights  PT Class (Do Not Modify): Inpatient [101]  PT Acc Code (Do Not Modify): Private [1]       Medical History Past Medical History:  Diagnosis Date  . Abnormal uterine bleeding (AUB)   . Anxiety   . Cleft lip   . Complication of anesthesia    pt was given 2 shots for sedation for a past surgery that made pt aggressive.  Mom went back with pt for last surgery, pt did well.  . Dementia (HCC)   . Dementia (HCC)   . Depression   . Down's syndrome   . Hypsarrhythmia (HCC)   . Irregular periods/menstrual cycles   . Seizures (HCC)    last episode 07/19/16.  2-3x/mo    Allergies Allergies  Allergen Reactions  . Lamotrigine Rash    Rash on torso and arms    IV Location/Drains/Wounds Patient Lines/Drains/Airways Status   Active Line/Drains/Airways    Name:   Placement date:   Placement time:   Site:   Days:   Peripheral IV 12-17-18 Left Hand   12-17-18    1247    Hand   less than 1   Incision (Closed) 08/05/16 Ear Other (Comment)   08/05/16    0748     874          Labs/Imaging Results for orders placed or performed during the hospital encounter of 12-17-18 (from the past 48 hour(s))  CBC with Differential/Platelet     Status: Abnormal   Collection Time: 12-17-18 12:37 PM  Result Value Ref Range   WBC 10.1 4.0 - 10.5 K/uL   RBC 3.71 (L)  3.87 - 5.11 MIL/uL   Hemoglobin 12.9 12.0 - 15.0 g/dL   HCT 29.539.7 62.136.0 - 30.846.0 %   MCV 107.0 (H) 80.0 - 100.0 fL   MCH 34.8 (H) 26.0 - 34.0 pg   MCHC 32.5 30.0 - 36.0 g/dL   RDW 65.714.1 84.611.5 - 96.215.5 %   Platelets 229 150 - 400 K/uL   nRBC 0.0 0.0 - 0.2 %   Neutrophils Relative % 86 %   Neutro Abs 8.7 (H) 1.7 - 7.7 K/uL   Lymphocytes Relative 8 %   Lymphs Abs 0.8 0.7 - 4.0 K/uL   Monocytes Relative 4 %   Monocytes Absolute 0.4 0.1 - 1.0 K/uL   Eosinophils Relative 1 %   Eosinophils Absolute 0.1 0.0 - 0.5 K/uL   Basophils Relative 1 %   Basophils Absolute 0.1 0.0 - 0.1 K/uL   Immature Granulocytes 0 %   Abs Immature Granulocytes 0.04 0.00 - 0.07 K/uL    Comment: Performed at [facility], [address] [place], [place] [address]  Comprehensive metabolic panel     Status: Abnormal   Collection Time: 12-17-18 12:37 PM  Result Value Ref Range   Sodium 138 135 - 145 mmol/L   Potassium 4.3 3.5 - 5.1 mmol/L  Chloride 100 98 - 111 mmol/L   CO2 31 22 - 32 mmol/L   Glucose, Bld 83 70 - 99 mg/dL   BUN 14 6 - 20 mg/dL   Creatinine, Ser 3.33 0.44 - 1.00 mg/dL   Calcium 8.4 (L) 8.9 - 10.3 mg/dL   Total Protein 7.0 6.5 - 8.1 g/dL   Albumin 2.8 (L) 3.5 - 5.0 g/dL   AST 30 15 - 41 U/L   ALT 9 0 - 44 U/L   Alkaline Phosphatase 104 38 - 126 U/L   Total Bilirubin 0.3 0.3 - 1.2 mg/dL   GFR calc non Af Amer >60 >60 mL/min   GFR calc Af Amer >60 >60 mL/min   Anion gap 7 5 - 15    Comment: Performed at [facility], [address] [place], [place] [address]  Influenza panel by PCR (type A & B)     Status: Abnormal   Collection Time: 01/24/19 12:37 PM  Result Value Ref Range   Influenza A By PCR POSITIVE (A) NEGATIVE   Influenza B By PCR NEGATIVE NEGATIVE    Comment: (NOTE) The Xpert Xpress Flu assay is intended as an aid in the diagnosis of  influenza and should not be used as a sole basis for treatment.  This  assay is FDA approved for nasopharyngeal swab specimens only.  Nasal  washings and aspirates are unacceptable for Xpert Xpress Flu testing. Performed at [facility], [address] [place], [place] [address]   Troponin I - ONCE - STAT     Status: None   Collection Time: Jan 24, 2019 12:37 PM  Result Value Ref Range   Troponin I <0.03 <0.03 ng/mL    Comment: Performed at [facility], [address] [place], [place] [address]   Dg Chest 2 View  Result Date: 2019-01-24 CLINICAL DATA:  Decreased oxygen saturation.  Cough. EXAM: CHEST - 2 VIEW COMPARISON:  August 10, 2017 FINDINGS: There is airspace opacity in the right lower lobe consistent with pneumonia. There is mild atelectatic change in the left lower lobe. Lungs elsewhere are clear. Heart size and pulmonary vascularity are normal. No adenopathy. No bone lesions. IMPRESSION: Airspace consolidation, felt to represent pneumonia, right lower lobe. Left base atelectasis present. Heart size normal. No evident adenopathy. Followup PA and lateral chest radiographs recommended in 3-4 weeks following trial of antibiotic therapy to ensure resolution and exclude underlying malignancy. Electronically Signed   By: Bretta Bang III M.D.   On: 01/24/2019 13:33    Pending Labs [employer] (From admission, onward)    Start     Ordered   Signed and Held  Culture, blood (routine x 2) Call MD if unable to obtain prior to antibiotics being given  BLOOD CULTURE X 2,   R    Comments:  If blood cultures drawn in Emergency Department - Do not draw and cancel order    Signed and Held   Signed and Held  Culture, sputum-assessment  Once,   R     Signed and Held   Signed and Held  Gram stain  Once,   R     Signed and Held   Signed and Held  HIV antibody (Routine Screening)  Once,   R     Signed and Held   Signed and Held  Strep pneumoniae urinary antigen  Once,   R     Signed and Held   Signed and Held  CBC  (enoxaparin (LOVENOX)  CrCl >/= 30 ml/min)  Once,   R    Comments:  Baseline for  enoxaparin therapy IF NOT ALREADY DRAWN.  Notify MD if PLT < 100 K.    Signed and Held   Signed and Held  Creatinine, serum  (enoxaparin (LOVENOX)    CrCl >/= 30 ml/min)  Once,   R    Comments:  Baseline for enoxaparin therapy IF NOT ALREADY DRAWN.    Signed and Held   Signed and Held  Creatinine, serum  (enoxaparin (LOVENOX)    CrCl >/= 30 ml/min)  Weekly,   R    Comments:  while on enoxaparin therapy    Signed and Held   Signed and Held  Legionella Pneumophila Serogp 1 Ur Ag  Once,   R     Signed and Held          Vitals/Pain Today's Vitals   01/01/19 1137 Jan 01, 2019 1141 2019-01-01 1343 Jan 01, 2019 1414  BP:  91/70 (!) 88/61   Pulse: 93  93   Resp: 18  (!) 22   Temp:    97.7 F (36.5 C)  TempSrc:    Axillary  SpO2: 90%  96%     Isolation Precautions Droplet precaution  Medications Medications  cefTRIAXone (ROCEPHIN) 1 g in sodium chloride 0.9 % 100 mL IVPB (1 g Intravenous Transfusing/Transfer 2019/01/01 1431)  azithromycin (ZITHROMAX) 500 mg in sodium chloride 0.9 % 250 mL IVPB (has no administration in time range)  sodium chloride 0.9 % bolus 1,000 mL (1,000 mLs Intravenous Transfusing/Transfer 01/01/2019 1431)  ipratropium-albuterol (DUONEB) 0.5-2.5 (3) MG/3ML nebulizer solution 3 mL (3 mLs Nebulization Given 01-Jan-2019 1240)  ipratropium-albuterol (DUONEB) 0.5-2.5 (3) MG/3ML nebulizer solution 3 mL (3 mLs Nebulization Given 01-01-2019 1241)  oseltamivir (TAMIFLU) 6 MG/ML suspension 75 mg (75 mg Oral Given 01-01-2019 1418)    Mobility manual wheelchair

## 2018-12-27 NOTE — Progress Notes (Signed)
Pharmacy Antibiotic Note  Hannah Gardner is a 53 y.o. female admitted on 10-Jan-2019 with pneumonia.  Pharmacy has been consulted for Vancomycin, Cefepime  dosing.  Plan: Zosyn 3.375 gm IV Q8H EI ordered to start on 2/17 @ 2200.  Vancomycin 1250 mg IV X ordered to be given on 2/17 @ 2200. Vancomycin 750 mg IV Q24H ordered to start 2/18 @ 2300. No peak and trough currently ordered.   TBW = 47.2 kg IBW = 36.3 kg  CRCl = 48.6 ml/min  VD = 34 L  Ke = 0.04 hr-1 T1/2 = 17.2 hrs  AUC = 546  Css max = 35.6  Css min = 14    Height: 4\' 8"  (142.2 cm) Weight: 104 lb 1.6 oz (47.2 kg) IBW/kg (Calculated) : 36.3  Temp (24hrs), Avg:99.2 F (37.3 C), Min:97.7 F (36.5 C), Max:101 F (38.3 C)  Recent Labs  Lab 01-10-2019 1237  WBC 10.1  CREATININE 0.87    Estimated Creatinine Clearance: 48.6 mL/min (by C-G formula based on SCr of 0.87 mg/dL).    Allergies  Allergen Reactions  . Lamotrigine Rash    Rash on torso and arms    Antimicrobials this admission:   >>    >>   Dose adjustments this admission:   Microbiology results:  BCx:   UCx:    Sputum:    MRSA PCR:   Thank you for allowing pharmacy to be a part of this patient's care.  Tyshun Tuckerman D Jan 10, 2019 10:21 PM

## 2018-12-27 NOTE — Progress Notes (Signed)
PHARMACY NOTE:  ANTIMICROBIAL RENAL DOSAGE ADJUSTMENT  Current antimicrobial regimen includes a mismatch between antimicrobial dosage and estimated renal function.  As per policy approved by the Pharmacy & Therapeutics and Medical Executive Committees, the antimicrobial dosage will be adjusted accordingly.  Current antimicrobial dosage:  Tamiflu 75 mg PO BID   Indication: Flu   Renal Function:  Estimated Creatinine Clearance: 48.6 mL/min (by C-G formula based on SCr of 0.87 mg/dL). []      On intermittent HD, scheduled: []      On CRRT    Antimicrobial dosage has been changed to:  Tamiflu 30 mg PO BID   Additional comments:   Thank you for allowing pharmacy to be a part of this patient's care.  Aundra Espin D, [facility] January 16, 2019 3:54 PM

## 2018-12-27 NOTE — H&P (Signed)
[employer] [facility] [place] [facility]   PATIENT NAME: Hannah Gardner    MR#:  [id number]  DATE OF BIRTH:  Jul 05, 1966  DATE OF ADMISSION:  12/25/2018  PRIMARY CARE PHYSICIAN: Lauro Regulus, MD   REQUESTING/REFERRING PHYSICIAN:   CHIEF COMPLAINT:   Chief Complaint  Patient presents with  . Cough    HISTORY OF PRESENT ILLNESS: Hannah Gardner  is a 53 y.o. female with a known history per below presenting from local group home, was seen in congenital clinic, found to be hypoxic with O2 saturation into the 80s, in the emergency room patient was noted to have fever, coughing, chest x-ray noted for pneumonia, influenza A positive, patient evaluated in the emergency room per family members present, patient is poor historian due to [facility] dementia, patient is now been admitted for acute hypoxic respiratory failure secondary to community-acquired pneumonia and influenza A infection.  PAST MEDICAL HISTORY:   Past Medical History:  Diagnosis Date  . Abnormal uterine bleeding (AUB)   . Anxiety   . Cleft lip   . Complication of anesthesia    pt was given 2 shots for sedation for a past surgery that made pt aggressive.  Mom went back with pt for last surgery, pt did well.  . Dementia (HCC)   . Dementia (HCC)   . Depression   . Down's syndrome   . Hypsarrhythmia (HCC)   . Irregular periods/menstrual cycles   . Seizures (HCC)    last episode 07/19/16.  2-3x/mo    PAST SURGICAL HISTORY:  Past Surgical History:  Procedure Laterality Date  . CERUMEN REMOVAL Bilateral 04/18/2015   Procedure: CERUMEN REMOVAL, Bilateral ;  Surgeon: Geanie Logan, MD;  Location: [facility] ORS;  Service: ENT;  Laterality: Bilateral;  . CERUMEN REMOVAL Bilateral 08/05/2016   Procedure: CERUMEN REMOVAL;  Surgeon: Geanie Logan, MD;  Location: [facility];  Service: ENT;  Laterality: Bilateral;  . EAR EXAMINATION UNDER ANESTHESIA Bilateral    remove cerumen from ears  . ESOPHAGOGASTRODUODENOSCOPY (EGD)  WITH PROPOFOL N/A 02/24/2018   Procedure: ESOPHAGOGASTRODUODENOSCOPY (EGD) WITH PROPOFOL;  Surgeon: Toledo, Boykin Nearing, MD;  Location: [facility] ENDOSCOPY;  Service: Gastroenterology;  Laterality: N/A;  . TOOTH EXTRACTION      SOCIAL HISTORY:  Social History   Tobacco Use  . Smoking status: Never Smoker  . Smokeless tobacco: Never Used  Substance Use Topics  . Alcohol use: No    FAMILY HISTORY:  Family History  Problem Relation Age of Onset  . Cancer Neg Hx   . Diabetes Neg Hx   . Heart disease Neg Hx   . Breast cancer Neg Hx     DRUG ALLERGIES:  Allergies  Allergen Reactions  . Lamotrigine Rash    Rash on torso and arms    REVIEW OF SYSTEMS: Poor historian due to to [facility] dementia  CONSTITUTIONAL: No fever, fatigue or weakness.  EYES: No blurred or double vision.  EARS, NOSE, AND THROAT: No tinnitus or ear pain.  RESPIRATORY: No cough, shortness of breath, wheezing or hemoptysis.  CARDIOVASCULAR: No chest pain, orthopnea, edema.  GASTROINTESTINAL: No nausea, vomiting, diarrhea or abdominal pain.  GENITOURINARY: No dysuria, hematuria.  ENDOCRINE: No polyuria, nocturia,  HEMATOLOGY: No anemia, easy bruising or bleeding SKIN: No rash or lesion. MUSCULOSKELETAL: No joint pain or arthritis.   NEUROLOGIC: No tingling, numbness, weakness.  PSYCHIATRY: No anxiety or depression.   MEDICATIONS AT HOME:  Prior to Admission medications   Medication Sig Start Date End Date Taking?  Authorizing Provider  aluminum-magnesium hydroxide-simethicone (MAALOX) 200-200-20 MG/5ML SUSP Take 30 mLs by mouth every 4 (four) hours as needed (upset stomach).    [provider]  ammonium lactate (AMLACTIN) 12 % cream Apply topically 2 (two) times daily as needed for dry skin.     [provider]  ammonium lactate (AMLACTIN) 12 % cream Apply topically as needed for dry skin. 08/11/18   Defrancesco, Prentice Docker, MD  calamine lotion Apply 1 application topically as needed for itching.     [provider]  calcium carbonate (OS-CAL - DOSED IN MG OF ELEMENTAL CALCIUM) 1250 (500 Ca) MG tablet Take 1 tablet by mouth.    [provider]  Calcium Carbonate-Vitamin D (CALCIUM-VITAMIN D) 500-200 MG-UNIT per tablet Take 1 tablet by mouth 2 (two) times daily.    [provider]  divalproex (DEPAKOTE SPRINKLE) 125 MG capsule  05/23/17   [provider]  escitalopram (LEXAPRO) 10 MG tablet Take 10 mg by mouth 2 (two) times daily.    [provider]  hydrocerin (EUCERIN) CREA Apply 1 application topically at bedtime.    [provider]  hydrocortisone 2.5 % cream Apply topically 2 (two) times daily.    [provider]  hydrogen peroxide 1.5 % SOLN Place 4 drops into both ears See admin instructions. 2 times a month    [provider]  levETIRAcetam (KEPPRA) 500 MG tablet Take 3 tablets (1,500 mg total) by mouth 2 (two) times daily. 05/09/16   Emily Filbert, MD  levETIRAcetam (KEPPRA) 750 MG tablet  05/23/17   [provider]  loperamide (IMODIUM) 2 MG capsule Take 4 mg by mouth.    [provider]  loratadine (CLARITIN) 10 MG tablet Take by mouth. 04/29/17 07/14/18  [provider]  magnesium hydroxide (MILK OF MAGNESIA) 400 MG/5ML suspension Take 30 mLs by mouth every three (3) days as needed for mild constipation or moderate constipation.     [provider]  memantine (NAMENDA XR) 28 MG CP24 24 hr capsule Take 28 mg by mouth at bedtime.    [provider]  mirtazapine (REMERON) 15 MG tablet Take 15 mg by mouth at bedtime.    [provider]  Multiple Vitamin (MULTIVITAMIN WITH MINERALS) TABS tablet Take 1 tablet by mouth daily.    [provider]  nystatin ointment (MYCOSTATIN) Apply 1 application topically 2 (two) times daily. 07/26/18   Defrancesco, Prentice Docker, MD  nystatin-triamcinolone (MYCOLOG II) cream Apply 1 application topically 2 (two) times daily. For  10-14 days 07/14/18   Defrancesco, Prentice Docker, MD  polymixin-bacitracin (POLYSPORIN) 500-10000 UNIT/GM OINT ointment Apply 1 application topically as needed. For minor abrasions, scratches, or excoriated areas    [provider]  Pseudoeph-Chlorphen-DM [facility] COUGH/COLD) 15-1-5 MG/5ML LIQD Take 10 mLs by mouth every 6 (six) hours as needed. For cold symptoms    [provider]  pseudoephedrine (SUDAFED) 120 MG 12 hr tablet Take by mouth. 12/10/16   [provider]  rivastigmine (EXELON) 9.5 mg/24hr Place 9.5 mg onto the skin daily.    [provider]  senna (SENOKOT) 8.6 MG tablet Take by mouth.    [provider]  SUNSCREEN SPF30 EX Apply topically as needed. Prior to sun exposure    [provider]  Talc (BABY POWDER EX) Apply 1 application topically daily as needed (for moisture protection).    [provider]  traZODone (DESYREL) 50 MG tablet Take 100 mg by mouth at bedtime  as needed for sleep. Between 12 am-2 am     [provider]  triamcinolone ointment (KENALOG) 0.1 % Apply 1 application topically 2 (two) times daily as needed (for red, itchy skin).     [provider]  triamcinolone ointment (KENALOG) 0.1 % Apply 1 application topically 2 (two) times daily. 07/26/18   Defrancesco, Prentice Docker, MD  VIMPAT 150 MG TABS 150 mg 2 (two) times daily.  10/23/16   [provider]      PHYSICAL EXAMINATION:   VITAL SIGNS: Blood pressure (!) 88/61, pulse 93, temperature 97.7 F (36.5 C), temperature source Axillary, resp. rate (!) 22, SpO2 96 %.  GENERAL:  53 y.o.-year-old patient lying in the bed with no acute distress.  Frail-appearing EYES: Pupils equal, round, reactive to light and accommodation. No scleral icterus. Extraocular muscles intact.  HEENT: Head atraumatic, normocephalic. Oropharynx and nasopharynx clear.  NECK:  Supple, no jugular venous distention. No thyroid enlargement, no tenderness.  LUNGS:  Bilateral rhonchi with Rales. No use of accessory muscles of respiration.  CARDIOVASCULAR: S1, S2 normal. No murmurs, rubs, or gallops.  ABDOMEN: Soft, nontender, nondistended. Bowel sounds present. No organomegaly or mass.  EXTREMITIES: No pedal edema, cyanosis, or clubbing.  NEUROLOGIC: Cranial nerves II through XII are intact. MAES. Gait not checked.  PSYCHIATRIC: The patient is awake, alert, demented at baseline   SKIN: No obvious rash, lesion, or ulcer.   LABORATORY PANEL:   CBC Recent Labs  Lab 01/06/2019 1237  WBC 10.1  HGB 12.9  HCT 39.7  PLT 229  MCV 107.0*  MCH 34.8*  MCHC 32.5  RDW 14.1  LYMPHSABS 0.8  MONOABS 0.4  EOSABS 0.1  BASOSABS 0.1   ------------------------------------------------------------------------------------------------------------------  Chemistries  Recent Labs  Lab 12/23/2018 1237  NA 138  K 4.3  CL 100  CO2 31  GLUCOSE 83  BUN 14  CREATININE 0.87  CALCIUM 8.4*  AST 30  ALT 9  ALKPHOS 104  BILITOT 0.3   ------------------------------------------------------------------------------------------------------------------ CrCl cannot be calculated (Unknown ideal weight.). ------------------------------------------------------------------------------------------------------------------ No results for input(s): TSH, T4TOTAL, T3FREE, THYROIDAB in the last 72 hours.  Invalid input(s): FREET3   Coagulation profile No results for input(s): INR, PROTIME in the last 168 hours. ------------------------------------------------------------------------------------------------------------------- No results for input(s): DDIMER in the last 72 hours. -------------------------------------------------------------------------------------------------------------------  Cardiac Enzymes Recent Labs  Lab 01/05/2019 1237  TROPONINI <0.03    ------------------------------------------------------------------------------------------------------------------ Invalid input(s): POCBNP  ---------------------------------------------------------------------------------------------------------------  Urinalysis    Component Value Date/Time   COLORURINE YELLOW (A) 08/10/2017 2230   APPEARANCEUR CLEAR (A) 08/10/2017 2230   LABSPEC 1.019 08/10/2017 2230   PHURINE 7.0 08/10/2017 2230   GLUCOSEU NEGATIVE 08/10/2017 2230   HGBUR NEGATIVE 08/10/2017 2230   BILIRUBINUR NEGATIVE 08/10/2017 2230   KETONESUR 5 (A) 08/10/2017 2230   PROTEINUR NEGATIVE 08/10/2017 2230   NITRITE NEGATIVE 08/10/2017 2230   LEUKOCYTESUR NEGATIVE 08/10/2017 2230     RADIOLOGY: Dg Chest 2 View  Result Date: 12/24/2018 CLINICAL DATA:  Decreased oxygen saturation.  Cough. EXAM: CHEST - 2 VIEW COMPARISON:  August 10, 2017 FINDINGS: There is airspace opacity in the right lower lobe consistent with pneumonia. There is mild atelectatic change in the left lower lobe. Lungs elsewhere are clear. Heart size and pulmonary vascularity are normal. No adenopathy. No bone lesions. IMPRESSION: Airspace consolidation, felt to represent pneumonia, right lower lobe. Left base atelectasis present. Heart size normal. No evident adenopathy. Followup PA and lateral chest radiographs recommended in 3-4 weeks following trial of antibiotic therapy to ensure resolution  and exclude underlying malignancy. Electronically Signed   By: Bretta BangWilliam  Woodruff III M.D.   On: 12/24/2018 13:33    EKG: Orders placed or performed during the hospital encounter of 12/13/2018  . ED EKG  . ED EKG  . EKG 12-Lead  . EKG 12-Lead    IMPRESSION AND PLAN: *Acute hypoxic respiratory failure secondary to influenza A infection and community-acquired pneumonia Supplemental oxygen with weaning as tolerated  *Acute influenza A infection Topamax twice daily with supportive care  *Acute CAP Empiric  Rocephin/azithromycin, pneumonia protocol, follow-up on cultures  *Chronic Down's dementia Increase nursing care PRN, aspiration/fall/skin care precautions while in house  *Chronic seizure disorder Stable Continue home AEDs, seizure precautions  All the records are reviewed and case discussed with ED provider. Management plans discussed with the patient, family and they are in agreement.  CODE STATUS:full  TOTAL TIME TAKING CARE OF THIS PATIENT: 40 minutes.    Evelena AsaMontell D Salary M.D on 12/12/2018   Between 7am to 6pm - Pager - [phone]  After 6pm go to [email or web address] - Social research officer, governmentpassword [email or web address] [facility]  [facility]  Office  [phone]  CC: Primary care physician; Lauro RegulusAnderson, Marshall W, MD   Note: This dictation was prepared with Dragon dictation along with smaller phrase technology. Any transcriptional errors that result from this process are unintentional.

## 2018-12-27 NOTE — ED Triage Notes (Signed)
PT sent from [facility] for low o2 sats. Pt family and medical POA at bedside. PT pt 91% on RA. Per family pt has increased cough and low grade fevers at home. PT in NAD at this time, pt sleeping.

## 2018-12-27 NOTE — ED Provider Notes (Addendum)
[facility] Emergency Department Provider Note  ____________________________________________  Time seen: Approximately 1:51 PM  I have reviewed the triage vital signs and the nursing notes.   HISTORY  Chief Complaint Cough  Level 5 caveat:  Portions of the history and physical were unable to be obtained due to non verbal, dementia   HPI Hannah HoraSusan Gardner is a 53 y.o. female with a history of Down syndrome, dementia, and autism who presents for evaluation of cough.  Patient has had 2 days of productive cough, decreased level of activity.  Low-grade fever of 2F at home. No vomiting or diarrhea.  Patient is nonverbal at baseline.   Past Medical History:  Diagnosis Date  . Abnormal uterine bleeding (AUB)   . Anxiety   . Cleft lip   . Complication of anesthesia    pt was given 2 shots for sedation for a past surgery that made pt aggressive.  Mom went back with pt for last surgery, pt did well.  . Dementia (HCC)   . Dementia (HCC)   . Depression   . Down's syndrome   . Hypsarrhythmia (HCC)   . Irregular periods/menstrual cycles   . Seizures (HCC)    last episode 07/19/16.  2-3x/mo    Patient Active Problem List   Diagnosis Date Noted  . Yeast infection 07/14/2018  . Pressure injury of back, stage 1 07/14/2018  . Menopause 11/25/2017  . Aspiration into airway 05/04/2017  . Ataxia 04/29/2017  . Mild protein-calorie malnutrition (HCC) 01/14/2017  . Mass of upper outer quadrant of right breast 11/19/2016  . Difficulty in walking 11/23/2014  . Seizure (HCC) 11/23/2014  . Disordered sleep 11/23/2014  . 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 AD (Alzheimer's disease) (HCC) 04/05/2014  . 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 CERUMEN REMOVAL Bilateral 04/18/2015   Procedure: CERUMEN REMOVAL, Bilateral ;  Surgeon: Geanie LoganPaul Bennett, MD;  Location: [facility] ORS;  Service: ENT;  Laterality:  Bilateral;  . CERUMEN REMOVAL Bilateral 08/05/2016   Procedure: CERUMEN REMOVAL;  Surgeon: Geanie LoganPaul Bennett, MD;  [facility];  Service: ENT;  Laterality: Bilateral;  . EAR EXAMINATION UNDER ANESTHESIA Bilateral    remove cerumen from ears  . ESOPHAGOGASTRODUODENOSCOPY (EGD) WITH PROPOFOL N/A 02/24/2018   Procedure: ESOPHAGOGASTRODUODENOSCOPY (EGD) WITH PROPOFOL;  Surgeon: Toledo, Boykin Nearingeodoro K, MD;  Location: [facility] ENDOSCOPY;  Service: Gastroenterology;  Laterality: N/A;  . TOOTH EXTRACTION      Prior to Admission medications   Medication Sig Start Date End Date Taking? Authorizing Provider  aluminum-magnesium hydroxide-simethicone (MAALOX) 200-200-20 MG/5ML SUSP Take 30 mLs by mouth every 4 (four) hours as needed (upset stomach).    [provider]  ammonium lactate (AMLACTIN) 12 % cream Apply topically 2 (two) times daily as needed for dry skin.     [provider]  ammonium lactate (AMLACTIN) 12 % cream Apply topically as needed for dry skin. 08/11/18   Defrancesco, Prentice DockerMartin A, MD  calamine lotion Apply 1 application topically as needed for itching.    [provider]  calcium carbonate (OS-CAL - DOSED IN MG OF ELEMENTAL CALCIUM) 1250 (500 Ca) MG tablet Take 1 tablet by mouth.    [provider]  Calcium Carbonate-Vitamin D (CALCIUM-VITAMIN D) 500-200 MG-UNIT per tablet Take 1 tablet by mouth 2 (two) times daily.    [provider]  divalproex (DEPAKOTE SPRINKLE) 125 MG capsule  05/23/17   [provider]  escitalopram (LEXAPRO) 10 MG tablet Take 10 mg by mouth 2 (two) times daily.    [provider]  hydrocerin (EUCERIN) CREA Apply 1 application topically at bedtime.    [provider]  hydrocortisone 2.5 % cream Apply topically 2 (two) times daily.    [provider]  hydrogen peroxide 1.5 % SOLN Place 4 drops into both ears See admin instructions. 2 times a month    [provider]    levETIRAcetam (KEPPRA) 500 MG tablet Take 3 tablets (1,500 mg total) by mouth 2 (two) times daily. 05/09/16   Emily Filbert, MD  levETIRAcetam (KEPPRA) 750 MG tablet  05/23/17   [provider]  loperamide (IMODIUM) 2 MG capsule Take 4 mg by mouth.    [provider]  loratadine (CLARITIN) 10 MG tablet Take by mouth. 04/29/17 07/14/18  [provider]  magnesium hydroxide (MILK OF MAGNESIA) 400 MG/5ML suspension Take 30 mLs by mouth every three (3) days as needed for mild constipation or moderate constipation.     [provider]  memantine (NAMENDA XR) 28 MG CP24 24 hr capsule Take 28 mg by mouth at bedtime.    [provider]  mirtazapine (REMERON) 15 MG tablet Take 15 mg by mouth at bedtime.    [provider]  Multiple Vitamin (MULTIVITAMIN WITH MINERALS) TABS tablet Take 1 tablet by mouth daily.    [provider]  nystatin ointment (MYCOSTATIN) Apply 1 application topically 2 (two) times daily. 07/26/18   Defrancesco, Prentice Docker, MD  nystatin-triamcinolone (MYCOLOG II) cream Apply 1 application topically 2 (two) times daily. For 10-14 days 07/14/18   Defrancesco, Prentice Docker, MD  polymixin-bacitracin (POLYSPORIN) 500-10000 UNIT/GM OINT ointment Apply 1 application topically as needed. For minor abrasions, scratches, or excoriated areas    [provider]  Pseudoeph-Chlorphen-DM [facility] COUGH/COLD) 15-1-5 MG/5ML LIQD Take 10 mLs by mouth every 6 (six) hours as needed. For cold symptoms    [provider]  pseudoephedrine (SUDAFED) 120 MG 12 hr tablet Take by mouth. 12/10/16   [provider]  rivastigmine (EXELON) 9.5 mg/24hr Place 9.5 mg onto the skin daily.    [provider]  senna (SENOKOT) 8.6 MG tablet Take by mouth.    [provider]  SUNSCREEN SPF30 EX Apply topically as needed. Prior to sun exposure    [provider]  Talc (BABY POWDER EX) Apply 1 application topically  daily as needed (for moisture protection).    [provider]  traZODone (DESYREL) 50 MG tablet Take 100 mg by mouth at bedtime as needed for sleep. Between 12 am-2 am     [provider]  triamcinolone ointment (KENALOG) 0.1 % Apply 1 application topically 2 (two) times daily as needed (for red, itchy skin).     [provider]  triamcinolone ointment (KENALOG) 0.1 % Apply 1 application topically 2 (two) times daily. 07/26/18   Defrancesco, Prentice Docker, MD  VIMPAT 150 MG TABS 150 mg 2 (two) times daily.  10/23/16   [provider]    Allergies Lamotrigine  Family History  Problem Relation Age of Onset  . Cancer Neg Hx   . Diabetes Neg Hx   . Heart disease Neg Hx   . Breast cancer Neg Hx     Social History Social History   Tobacco Use  . Smoking status: Never Smoker  . Smokeless tobacco: Never Used  Substance Use Topics  . Alcohol use: No  . Drug use:  No    Review of Systems  Constitutional: +fever. Respiratory: Negative for shortness of breath. Gastrointestinal: Negative for vomiting or diarrhea.  ____________________________________________   PHYSICAL EXAM:  VITAL SIGNS: ED Triage Vitals  Enc Vitals Group     BP 01/05/2019 1141 91/70     Pulse Rate 12/20/2018 1137 93     Resp 12/23/2018 1137 18     Temp --      Temp src --      SpO2 01/06/2019 1137 90 %     Weight --      Height --      Head Circumference --      Peak Flow --      Pain Score --      Pain Loc --      Pain Edu? --      Excl. in GC? --     Constitutional: Alert, in no apparent distress. HEENT:      Head: Normocephalic and atraumatic.         Eyes: Conjunctivae are normal. Sclera is non-icteric.       Mouth/Throat: Mucous membranes are dry.       Neck: Supple with no signs of meningismus. Cardiovascular: Regular rate and rhythm. No murmurs, gallops, or rubs. 2+ symmetrical distal pulses are present in all extremities. No JVD. Respiratory: Normal respiratory  effort. Hypoxic. Diffuse crackles and rhonchi Gastrointestinal: Soft, non tender, and non distended with positive bowel sounds. No rebound or guarding. Musculoskeletal: Nontender with normal range of motion in all extremities. No edema, cyanosis, or erythema of extremities. Neurologic: Face is symmetric. Moving all extremities. No gross focal neurologic deficits are appreciated. Skin: Skin is warm, dry and intact. No rash noted. Psychiatric: Mood and affect are normal. Speech and behavior are normal.  ____________________________________________   LABS (all labs ordered are listed, but only abnormal results are displayed)  Labs Reviewed  CBC WITH DIFFERENTIAL/PLATELET - Abnormal; Notable for the following components:      Result Value   RBC 3.71 (*)    MCV 107.0 (*)    MCH 34.8 (*)    Neutro Abs 8.7 (*)    All other components within normal limits  COMPREHENSIVE METABOLIC PANEL - Abnormal; Notable for the following components:   Calcium 8.4 (*)    Albumin 2.8 (*)    All other components within normal limits  INFLUENZA PANEL BY PCR (TYPE A & B) - Abnormal; Notable for the following components:   Influenza A By PCR POSITIVE (*)    All other components within normal limits  TROPONIN I   ____________________________________________  EKG  ED ECG REPORT I, Nita Sickle, the attending physician, personally viewed and interpreted this ECG.  NSR, rate 92, normal intervals, borderline left axis deviation, LVH, Q waves in inferior and anterior leads, no ST elevations or depressions. No prior for comparison ____________________________________________  RADIOLOGY  I have personally reviewed the images performed during this visit and I agree with the Radiologist's read.   Interpretation by Radiologist:  Dg Chest 2 View  Result Date: 12/18/2018 CLINICAL DATA:  Decreased oxygen saturation.  Cough. EXAM: CHEST - 2 VIEW COMPARISON:  August 10, 2017 FINDINGS: There is airspace  opacity in the right lower lobe consistent with pneumonia. There is mild atelectatic change in the left lower lobe. Lungs elsewhere are clear. Heart size and pulmonary vascularity are normal. No adenopathy. No bone lesions. IMPRESSION: Airspace consolidation, felt to represent pneumonia, right lower lobe. Left base atelectasis present. Heart size  normal. No evident adenopathy. Followup PA and lateral chest radiographs recommended in 3-4 weeks following trial of antibiotic therapy to ensure resolution and exclude underlying malignancy. Electronically Signed   By: Bretta Bang III M.D.   On: 01-16-2019 13:33     ____________________________________________   PROCEDURES  Procedure(s) performed: None Procedures Critical Care performed:  Yes  CRITICAL CARE Performed by: Nita Sickle  ?  Total critical care time: 30 min  Critical care time was exclusive of separately billable procedures and treating other patients.  Critical care was necessary to treat or prevent imminent or life-threatening deterioration.  Critical care was time spent personally by me on the following activities: development of treatment plan with patient and/or surrogate as well as nursing, discussions with consultants, evaluation of patient's response to treatment, examination of patient, obtaining history from patient or surrogate, ordering and performing treatments and interventions, ordering and review of laboratory studies, ordering and review of radiographic studies, pulse oximetry and re-evaluation of patient's condition.  ____________________________________________   INITIAL IMPRESSION / ASSESSMENT AND PLAN / ED COURSE   53 y.o. female with a history of Down syndrome, dementia, and autism who presents for evaluation of cough and fever.  Patient was hypoxic to 89% upon arrival and placed on 2 L nasal cannula.  She has diffuse wheezing, rhonchi and crackles bilaterally.  Chest x-ray concerning for  pneumonia.  Flu a positive.  Patient was given 1 DuoNeb treatment, Tamiflu, Rocephin and azithromycin.  At this time no signs of sepsis.  Patient was given IV fluids.  Discussed with Dr. Katheren Shams for admission      As part of my medical decision making, I reviewed the following data within the electronic MEDICAL RECORD NUMBER Nursing notes reviewed and incorporated, Labs reviewed , EKG interpreted , Old chart reviewed, Discussed with PCP , Notes from prior ED visits and [place] Controlled Substance Database    Pertinent labs & imaging results that were available during my care of the patient were reviewed by me and considered in my medical decision making (see chart for details).    ____________________________________________   FINAL CLINICAL IMPRESSION(S) / ED DIAGNOSES  Final diagnoses:  Acute respiratory failure with hypoxia (HCC)  Community acquired pneumonia, unspecified laterality  Influenza A      NEW MEDICATIONS STARTED DURING THIS VISIT:  ED Discharge Orders    None       Note:  This document was prepared using Dragon voice recognition software and may include unintentional dictation errors.    Don Perking, [place], MD January 16, 2019 1404    Nita Sickle, MD 01/05/19 1537

## 2018-12-28 LAB — CBC WITH DIFFERENTIAL/PLATELET
Abs Immature Granulocytes: 0.03 10*3/uL (ref 0.00–0.07)
Basophils Absolute: 0 10*3/uL (ref 0.0–0.1)
Basophils Relative: 0 %
Eosinophils Absolute: 0 10*3/uL (ref 0.0–0.5)
Eosinophils Relative: 0 %
HCT: 36.8 % (ref 36.0–46.0)
Hemoglobin: 11.7 g/dL — ABNORMAL LOW (ref 12.0–15.0)
Immature Granulocytes: 1 %
LYMPHS ABS: 0.3 10*3/uL — AB (ref 0.7–4.0)
Lymphocytes Relative: 7 %
MCH: 34.5 pg — ABNORMAL HIGH (ref 26.0–34.0)
MCHC: 31.8 g/dL (ref 30.0–36.0)
MCV: 108.6 fL — ABNORMAL HIGH (ref 80.0–100.0)
MONOS PCT: 7 %
Monocytes Absolute: 0.4 10*3/uL (ref 0.1–1.0)
Neutro Abs: 4 10*3/uL (ref 1.7–7.7)
Neutrophils Relative %: 85 %
Platelets: 185 10*3/uL (ref 150–400)
RBC: 3.39 MIL/uL — ABNORMAL LOW (ref 3.87–5.11)
RDW: 14.4 % (ref 11.5–15.5)
WBC: 4.8 10*3/uL (ref 4.0–10.5)
nRBC: 0 % (ref 0.0–0.2)

## 2018-12-28 LAB — BASIC METABOLIC PANEL
Anion gap: 4 — ABNORMAL LOW (ref 5–15)
BUN: 13 mg/dL (ref 6–20)
CALCIUM: 8.1 mg/dL — AB (ref 8.9–10.3)
CO2: 29 mmol/L (ref 22–32)
Chloride: 106 mmol/L (ref 98–111)
Creatinine, Ser: 1.04 mg/dL — ABNORMAL HIGH (ref 0.44–1.00)
GFR calc Af Amer: >60 mL/min (ref >60)
GFR calc non Af Amer: >60 mL/min (ref >60)
Glucose, Bld: 96 mg/dL (ref 70–99)
Potassium: 3.7 mmol/L (ref 3.5–5.1)
Sodium: 139 mmol/L (ref 135–145)

## 2018-12-28 MED ORDER — LEVETIRACETAM 100 MG/ML PO SOLN
1250.0000 mg | Freq: Two times a day (BID) | ORAL | Status: DC
  Administered 2018-12-29 – 2018-12-30 (×4): 1250 mg via ORAL
  Filled 2018-12-28: qty 15
  Filled 2018-12-28: qty 12.5
  Filled 2018-12-28 (×5): qty 15

## 2018-12-28 MED ORDER — LACOSAMIDE 50 MG PO TABS
200.0000 mg | ORAL_TABLET | Freq: Two times a day (BID) | ORAL | Status: DC
  Administered 2018-12-28 – 2018-12-30 (×4): 200 mg via ORAL
  Filled 2018-12-28 (×4): qty 4

## 2018-12-28 MED ORDER — VANCOMYCIN HCL IN DEXTROSE 1-5 GM/200ML-% IV SOLN
1000.0000 mg | INTRAVENOUS | Status: DC
  Administered 2018-12-29 – 2018-12-31 (×2): 1000 mg via INTRAVENOUS
  Filled 2018-12-28 (×3): qty 200

## 2018-12-28 MED ORDER — IPRATROPIUM-ALBUTEROL 0.5-2.5 (3) MG/3ML IN SOLN
3.0000 mL | Freq: Four times a day (QID) | RESPIRATORY_TRACT | Status: DC
  Administered 2018-12-28 – 2018-12-31 (×11): 3 mL via RESPIRATORY_TRACT
  Filled 2018-12-28 (×13): qty 3

## 2018-12-28 MED ORDER — SODIUM CHLORIDE 0.9 % IV BOLUS
1000.0000 mL | Freq: Once | INTRAVENOUS | Status: AC
  Administered 2018-12-28: 1000 mL via INTRAVENOUS

## 2018-12-28 MED ORDER — DIVALPROEX SODIUM 125 MG PO CSDR
375.0000 mg | DELAYED_RELEASE_CAPSULE | Freq: Two times a day (BID) | ORAL | Status: DC
  Administered 2018-12-29 – 2018-12-30 (×4): 375 mg via ORAL
  Filled 2018-12-28 (×6): qty 3

## 2018-12-28 MED ORDER — ACETAMINOPHEN 325 MG PO TABS
650.0000 mg | ORAL_TABLET | Freq: Four times a day (QID) | ORAL | Status: DC | PRN
  Administered 2018-12-28 – 2018-12-29 (×2): 650 mg via ORAL
  Filled 2018-12-28 (×2): qty 2

## 2018-12-28 MED ORDER — ALBUTEROL SULFATE (2.5 MG/3ML) 0.083% IN NEBU: 2.5000 mg | INHALATION_SOLUTION | RESPIRATORY_TRACT | Status: DC | PRN

## 2018-12-28 MED ORDER — VANCOMYCIN HCL IN DEXTROSE 1-5 GM/200ML-% IV SOLN
1000.0000 mg | INTRAVENOUS | Status: DC
  Filled 2018-12-28: qty 200

## 2018-12-28 MED ORDER — METHYLPREDNISOLONE SODIUM SUCC 125 MG IJ SOLR
60.0000 mg | Freq: Two times a day (BID) | INTRAMUSCULAR | Status: DC
  Administered 2018-12-28 (×2): 60 mg via INTRAVENOUS
  Filled 2018-12-28 (×2): qty 2

## 2018-12-28 MED ORDER — SODIUM CHLORIDE 0.9 % IV SOLN
INTRAVENOUS | Status: DC
  Administered 2018-12-28 (×2): via INTRAVENOUS

## 2018-12-28 MED ORDER — SODIUM CHLORIDE 0.9 % IV BOLUS
500.0000 mL | Freq: Once | INTRAVENOUS | Status: AC
  Administered 2018-12-28: 12:00:00 500 mL via INTRAVENOUS

## 2018-12-28 NOTE — Progress Notes (Signed)
Initial Nutrition Assessment  DOCUMENTATION CODES:   Not applicable  INTERVENTION:  Provide Hormel Shake (Vital Cuisine) po TID with trays, each supplement provides 520 kcal and 22 grams of protein. Patient prefers chocolate flavor.  NUTRITION DIAGNOSIS:   Inadequate oral intake related to decreased appetite, acute illness(flu, PNA) as evidenced by per patient/family report.  GOAL:   Patient will meet greater than or equal to 90% of their needs  MONITOR:   PO intake, Supplement acceptance, Labs, Weight trends, I & O's  REASON FOR ASSESSMENT:   Consult Assessment of nutrition requirement/status  ASSESSMENT:   53 year old female with PMHx of Down's syndrome, dementia, cleft lip, anxiety, depression, seizure disorder who is admitted with influenza A, bilateral PNA, acute metabolic encephalopathy.   Met with patient and her sister at bedside. Sister reports that at baseline patient has a good appetite and intake. She is on a pureed diet with nectar-thick liquids and eats well at meals. Acutely she has had a decreased appetite and intake since 2/16 due to not feeling well. She is still taking in bites/sips at meals. Sister reports patient drinks ONS when she does not feel well or loses weight. She is amenable to patient having Hormel Shakes on trays and reports patient would prefer chocolate. Patient also receiving Magic Cup on trays since she is on dysphagia 1 with nectar-thick and she is taking in some of those.   Sister reports her UBW is 104 lbs and has not had any recent weight loss. Patient is currently 47.2 kg (104.1 lbs). She has gotten as low as 80 lbs when she has been sick before but is always able to regain her weight when her appetite returns.  Medications reviewed and include: calcium carbonate 2.5 mg daily with breakfast, Diflucan, Keppra, Tamiflu, senna 2 tablets QHS, NS @ 100 mL/hr, Zosyn, vancomycin.  Labs reviewed: Creatinine 1.04, Anion gap 4.  Patient does not  meet criteria for malnutrition.  NUTRITION - FOCUSED PHYSICAL EXAM:    Most Recent Value  Orbital Region  No depletion  Upper Arm Region  No depletion  Thoracic and Lumbar Region  No depletion  Buccal Region  No depletion  Temple Region  No depletion  Clavicle Bone Region  No depletion  Clavicle and Acromion Bone Region  No depletion  Scapular Bone Region  Unable to assess  Dorsal Hand  No depletion  Patellar Region  Mild depletion  Anterior Thigh Region  Mild depletion  Posterior Calf Region  Mild depletion  Edema (RD Assessment)  None  Hair  Reviewed  Eyes  Unable to assess  Mouth  Unable to assess  Skin  Reviewed  Nails  Reviewed     Diet Order:   Diet Order            DIET - DYS 1 Room service appropriate? No; Fluid consistency: Nectar Thick  Diet effective now             EDUCATION NEEDS:   No education needs have been identified at this time  Skin:  Skin Assessment: Skin Integrity Issues:(MSAD to buttocks)  Last BM:  12/28/2018 - large type 6  Height:   Ht Readings from Last 1 Encounters:  01/04/2019 _0  (1.422 m)   Weight:   Wt Readings from Last 1 Encounters:  12/23/2018 47.2 kg   Ideal Body Weight:  42.4 kg  BMI:  Body mass index is 23.34 kg/m.  Estimated Nutritional Needs:   Kcal:  1200-1400  Protein:  60-70 grams  Fluid:  1.2-1.4 L/day  Willey Blade, MS, RD, LDN Office: [phone] Pager: [phone] After Hours/Weekend Pager: [phone]

## 2018-12-28 NOTE — Progress Notes (Signed)
Pharmacy Antibiotic Note  Hannah Gardner is a 53 y.o. female admitted on 01/03/2019 with pneumonia.  Pharmacy has been consulted for Vancomycin and Zosyn dosing. Patient is Influenza A (+)   Plan: Zosyn 3.375 gm IV Q8H EI ordered to start on 2/17 @ 2200.  Vancomycin 1250 mg IV X ordered 2/17 @ 2200.  Will adjust vancomycin dosing due to change in Scr.  Vancomycin 1000 mg IV Q 36 hrs. Goal AUC 400-550. Expected AUC: 571 SCr used: 1.04   Height: 4\' 8"  (142.2 cm) Weight: 104 lb 1.6 oz (47.2 kg) IBW/kg (Calculated) : 36.3  Temp (24hrs), Avg:100 F (37.8 C), Min:97.7 F (36.5 C), Max:102.8 F (39.3 C)  Recent Labs  Lab 01/03/2019 1237 12/26/2018 2216 12/28/18 1035  WBC 10.1  --  4.8  CREATININE 0.87  --  1.04*  LATICACIDVEN  --  1.0  --     Estimated Creatinine Clearance: 40.7 mL/min (A) (by C-G formula based on SCr of 1.04 mg/dL (H)).    Allergies  Allergen Reactions  . Lamotrigine Rash    Rash on torso and arms    Antimicrobials this admission:  2/17 Azithro/CTX >> x 1 dose 2/17 Vancomycin>> 2/17 Zosyn>> 2/17 Oseltamivir (Tamilfu) >>   Microbiology results: BCx: pending  Sputum:  pending MRSA PCR: pending Influenza A (+)   Thank you for allowing pharmacy to be a part of this patient's care.  Gardner Candle, PharmD, BCPS Clinical Pharmacist 12/28/2018 3:23 PM

## 2018-12-28 NOTE — Progress Notes (Signed)
[facility] [employer] - [place] [facility]   PATIENT NAME: Hannah Gardner    MR#:  [id number]  DATE OF BIRTH:  Jul 08, 1966  SUBJECTIVE:  CHIEF COMPLAINT:   Chief Complaint  Patient presents with  . Cough   Drowzy. Febrile. Tachycardia.  Sister at bedside.  Total care baseline. Non verbal but can let needs know to family. Today she is confused  REVIEW OF SYSTEMS:    Review of Systems  Unable to perform ROS: Dementia    DRUG ALLERGIES:   Allergies  Allergen Reactions  . Lamotrigine Rash    Rash on torso and arms    VITALS:  Blood pressure (!) 99/50, pulse (!) 117, temperature (!) 102.8 F (39.3 C), temperature source Oral, resp. rate 17, height 4\' 8"  (1.422 m), weight 47.2 kg, SpO2 (!) 89 %.  PHYSICAL EXAMINATION:   Physical Exam  GENERAL:  53 y.o.-year-old patient lying in the bed .  EYES: Pupils equal, round, reactive to light and accommodation. No scleral icterus. Extraocular muscles intact.  HEENT: Head atraumatic, normocephalic. Oropharynx and nasopharynx clear.  NECK:  Supple, no jugular venous distention. No thyroid enlargement, no tenderness.  LUNGS: Bilateral coarse breath sounds CARDIOVASCULAR: S1, S2 normal. No murmurs, rubs, or gallops.  ABDOMEN: Soft, nontender, nondistended. Bowel sounds present. No organomegaly or mass.  EXTREMITIES: No cyanosis, clubbing or edema b/l.    NEUROLOGIC: Not following instructions PSYCHIATRIC: The patient is drowzy SKIN: No obvious rash, lesion, or ulcer.   LABORATORY PANEL:   CBC Recent Labs  Lab 12/28/18 1035  WBC 4.8  HGB 11.7*  HCT 36.8  PLT 185   ------------------------------------------------------------------------------------------------------------------ Chemistries  Recent Labs  Lab 2019-01-01 1237 12/28/18 1035  NA 138 139  K 4.3 3.7  CL 100 106  CO2 31 29  GLUCOSE 83 96  BUN 14 13  CREATININE 0.87 1.04*  CALCIUM 8.4* 8.1*  AST 30  --   ALT 9  --   ALKPHOS 104  --   BILITOT 0.3   --    ------------------------------------------------------------------------------------------------------------------  Cardiac Enzymes Recent Labs  Lab 01/01/19 1237  TROPONINI <0.03   ------------------------------------------------------------------------------------------------------------------  RADIOLOGY:  Dg Chest 2 View  Result Date: January 01, 2019 CLINICAL DATA:  Decreased oxygen saturation.  Cough. EXAM: CHEST - 2 VIEW COMPARISON:  August 10, 2017 FINDINGS: There is airspace opacity in the right lower lobe consistent with pneumonia. There is mild atelectatic change in the left lower lobe. Lungs elsewhere are clear. Heart size and pulmonary vascularity are normal. No adenopathy. No bone lesions. IMPRESSION: Airspace consolidation, felt to represent pneumonia, right lower lobe. Left base atelectasis present. Heart size normal. No evident adenopathy. Followup PA and lateral chest radiographs recommended in 3-4 weeks following trial of antibiotic therapy to ensure resolution and exclude underlying malignancy. Electronically Signed   By: Bretta Bang III M.D.   On: 01/01/19 13:33     ASSESSMENT AND PLAN:   *Acute hypoxic respiratory failure secondary to influenza A infection and bilateral pneumonia with severe sepsis Tamiflu IV abx Bolus NS stat and start IVF. Cx pending Nebs scheduled ordered. Will also start solumedrol Acutely ill and will need stabilizing  * Acute metabolic encephalopathy over chronic dementia due to acute illness  *Chronic seizure disorder Stable Continue home AEDs, seizure precautions  All the records are reviewed and case discussed with Care Management/Social Workerr. Management plans discussed with the patient, family and they are in agreement.  CODE STATUS: DNR/DNI  Sister and Brother are joint HCPOA. Discussed with sister  at bedside. Patient is DNR/DNI  DVT Prophylaxis: SCDs  TOTAL CRITICAL CARE TIME TAKING CARE OF THIS PATIENT: 35  minutes.   POSSIBLE D/C IN 2-3 DAYS, DEPENDING ON CLINICAL CONDITION.  [name] M.D on 12/28/2018 at 11:40 AM  Between 7am to 6pm - Pager - [phone]  After 6pm go to [email or web address] - password [email or web address] [facility]  [facility] [place] [facility]  Office  [phone]  CC: Primary care physician; Lauro Regulus, MD  Note: This dictation was prepared with Dragon dictation along with smaller phrase technology. Any transcriptional errors that result from this process are unintentional.

## 2018-12-28 NOTE — Progress Notes (Signed)
SLP Cancellation Note  Patient Details Name: Hannah Gardner MRN: [id number] DOB: 08-17-1966   Cancelled treatment:       Reason Eval/Treat Not Completed: Medical issues which prohibited therapy;Fatigue/lethargy limiting ability to participate(chart reviewed; consulted Sister, NSG re: pt's status today).  Pt is not alert, responsive to verbal/tactile stim - was recently given medication for agitation per Sister's report. Sister also reported pt "is too sick right now and doesn't feel well to do much". NSG was able to give pt some of her Pills Crushed this morning but pt also has a few liquid medications(?).  Pt is [facility] a D[facility]. Recommend continuing the currently ordered Dysphagia level 1 (PUREE) diet w/ NECTAR consistency liquids VIA TSP as is baseline for pt, per Sister's report. Recommend Medications Crushed, or if in liquid form, mixed in Puree.  ST services will f/u tomorrow w/ BSE. NSG updated. Precautions posted in room, chart.     Jerilynn Som, MS, CCC-SLP Watson,Katherine 12/28/2018, 10:46 AM

## 2018-12-29 ENCOUNTER — Inpatient Hospital Stay: Payer: Medicare Other

## 2018-12-29 ENCOUNTER — Inpatient Hospital Stay
Admit: 2018-12-29 | Discharge: 2018-12-29 | Disposition: A | Discharge status: Home / Self Care | Payer: Medicare Other | Attending: Internal Medicine | Admitting: Internal Medicine

## 2018-12-29 DIAGNOSIS — J9601 Acute respiratory failure with hypoxia: Secondary | ICD-10-CM

## 2018-12-29 DIAGNOSIS — J1100 Influenza due to unidentified influenza virus with unspecified type of pneumonia: Secondary | ICD-10-CM

## 2018-12-29 LAB — BASIC METABOLIC PANEL
Anion gap: 7 (ref 5–15)
BUN: 16 mg/dL (ref 6–20)
CO2: 24 mmol/L (ref 22–32)
CREATININE: 1.03 mg/dL — AB (ref 0.44–1.00)
Calcium: 7.6 mg/dL — ABNORMAL LOW (ref 8.9–10.3)
Chloride: 112 mmol/L — ABNORMAL HIGH (ref 98–111)
GFR calc Af Amer: >60 mL/min (ref >60)
GFR calc non Af Amer: >60 mL/min (ref >60)
Glucose, Bld: 166 mg/dL — ABNORMAL HIGH (ref 70–99)
Potassium: 3.9 mmol/L (ref 3.5–5.1)
Sodium: 143 mmol/L (ref 135–145)

## 2018-12-29 LAB — CBC WITH DIFFERENTIAL/PLATELET
Abs Immature Granulocytes: 0.04 10*3/uL (ref 0.00–0.07)
Basophils Absolute: 0 10*3/uL (ref 0.0–0.1)
Basophils Relative: 0 %
EOS ABS: 0 10*3/uL (ref 0.0–0.5)
Eosinophils Relative: 0 %
HCT: 34.5 % — ABNORMAL LOW (ref 36.0–46.0)
Hemoglobin: 10.6 g/dL — ABNORMAL LOW (ref 12.0–15.0)
Immature Granulocytes: 1 %
Lymphocytes Relative: 9 %
Lymphs Abs: 0.5 10*3/uL — ABNORMAL LOW (ref 0.7–4.0)
MCH: 34.6 pg — ABNORMAL HIGH (ref 26.0–34.0)
MCHC: 30.7 g/dL (ref 30.0–36.0)
MCV: 112.7 fL — ABNORMAL HIGH (ref 80.0–100.0)
Monocytes Absolute: 0.1 10*3/uL (ref 0.1–1.0)
Monocytes Relative: 2 %
Neutro Abs: 5 10*3/uL (ref 1.7–7.7)
Neutrophils Relative %: 88 %
PLATELETS: 168 10*3/uL (ref 150–400)
RBC: 3.06 MIL/uL — ABNORMAL LOW (ref 3.87–5.11)
RDW: 14.4 % (ref 11.5–15.5)
WBC: 5.7 10*3/uL (ref 4.0–10.5)
nRBC: 0 % (ref 0.0–0.2)

## 2018-12-29 LAB — LACTIC ACID, PLASMA
LACTIC ACID, VENOUS: 3.7 mmol/L — AB (ref 0.5–1.9)
Lactic Acid, Venous: 2.9 mmol/L (ref 0.5–1.9)

## 2018-12-29 LAB — ECHOCARDIOGRAM COMPLETE
HEIGHTINCHES: 56 in
WEIGHTICAEL: 1665.6 [oz_av]

## 2018-12-29 LAB — GLUCOSE, CAPILLARY
GLUCOSE-CAPILLARY: 125 mg/dL — AB (ref 70–99)
Glucose-Capillary: 161 mg/dL — ABNORMAL HIGH (ref 70–99)
Glucose-Capillary: 147 mg/dL — ABNORMAL HIGH (ref 70–99)
Glucose-Capillary: 106 mg/dL — ABNORMAL HIGH (ref 70–99)

## 2018-12-29 LAB — PROCALCITONIN: Procalcitonin: 2.93 ng/mL

## 2018-12-29 LAB — TROPONIN I: Troponin I: <0.03 ng/mL (ref <0.03)

## 2018-12-29 LAB — STREP PNEUMONIAE URINARY ANTIGEN: Strep Pneumo Urinary Antigen: NEGATIVE

## 2018-12-29 LAB — BRAIN NATRIURETIC PEPTIDE: B NATRIURETIC PEPTIDE 5: 172 pg/mL — AB (ref 0.0–100.0)

## 2018-12-29 LAB — HIV ANTIBODY (ROUTINE TESTING W REFLEX): HIV Screen 4th Generation wRfx: NONREACTIVE

## 2018-12-29 LAB — MRSA PCR SCREENING: MRSA by PCR: POSITIVE — AB

## 2018-12-29 MED ORDER — PHENYLEPHRINE HCL-NACL 10-0.9 MG/250ML-% IV SOLN: 0.0000 ug/min | INTRAVENOUS | Status: DC

## 2018-12-29 MED ORDER — SODIUM CHLORIDE 0.9 % IV SOLN
1.0000 g | INTRAVENOUS | Status: DC
  Administered 2018-12-29: 1 g via INTRAVENOUS
  Filled 2018-12-29: qty 1
  Filled 2018-12-29 (×2): qty 10

## 2018-12-29 MED ORDER — LACTATED RINGERS IV BOLUS
250.0000 mL | Freq: Once | INTRAVENOUS | Status: AC
  Administered 2018-12-29: 250 mL via INTRAVENOUS

## 2018-12-29 MED ORDER — METRONIDAZOLE IN NACL 5-0.79 MG/ML-% IV SOLN
500.0000 mg | Freq: Four times a day (QID) | INTRAVENOUS | Status: DC
  Administered 2018-12-29: 500 mg via INTRAVENOUS
  Filled 2018-12-29 (×4): qty 100

## 2018-12-29 MED ORDER — SODIUM CHLORIDE 0.9 % IV SOLN
2.0000 g | Freq: Two times a day (BID) | INTRAVENOUS | Status: DC
  Administered 2018-12-29: 2 g via INTRAVENOUS
  Filled 2018-12-29 (×2): qty 2

## 2018-12-29 MED ORDER — LACTATED RINGERS IV SOLN
INTRAVENOUS | Status: DC
  Administered 2018-12-29: 10:00:00 via INTRAVENOUS
  Administered 2018-12-30: 35 mL/h via INTRAVENOUS

## 2018-12-29 MED ORDER — SODIUM CHLORIDE 0.9 % IV SOLN
INTRAVENOUS | Status: DC
  Administered 2018-12-29: 03:00:00 via INTRAVENOUS

## 2018-12-29 MED ORDER — SODIUM CHLORIDE 0.9 % IV SOLN
0.0000 ug/min | INTRAVENOUS | Status: DC
  Filled 2018-12-29: qty 1

## 2018-12-29 MED ORDER — SODIUM CHLORIDE 0.9 % IV BOLUS: 1000.0000 mL | INTRAVENOUS | Status: DC

## 2018-12-29 MED ORDER — ALBUTEROL SULFATE (2.5 MG/3ML) 0.083% IN NEBU
2.5000 mg | INHALATION_SOLUTION | RESPIRATORY_TRACT | Status: DC | PRN
  Administered 2018-12-30: 2.5 mg via RESPIRATORY_TRACT
  Filled 2018-12-29 (×2): qty 3

## 2018-12-29 NOTE — Progress Notes (Signed)
[employer] [facility] - [place] [facility]   PATIENT NAME: Hannah Gardner    MR#:  [id number]  DATE OF BIRTH:  1965/12/16  SUBJECTIVE:  CHIEF COMPLAINT:   Chief Complaint  Patient presents with  . Cough   Drowzy.  Rapid response was called by patient's RN Meade Maw for hypotension.  Patient has received 1 L IV fluid bolus with no significant improvement during my examination her blood pressure is still low at 60/50 with a map of 55  Brother Tim at bedside.  Agreeable to transfer the patient to stepdown/ICU unit  Total care baseline. Non verbal but can let needs know to family. Today she is confused  REVIEW OF SYSTEMS:    Review of Systems  Unable to perform ROS: Dementia    DRUG ALLERGIES:   Allergies  Allergen Reactions  . Lamotrigine Rash    Rash on torso and arms    VITALS:  Blood pressure (!) 60/50, pulse 61, temperature 98.4 F (36.9 C), temperature source Oral, resp. rate 19, height 4\' 8"  (1.422 m), weight 47.2 kg, SpO2 93 %.  PHYSICAL EXAMINATION:   Physical Exam  GENERAL:  53 y.o.-year-old patient lying in the bed .  EYES: Pupils equal, round, reactive to light and accommodation. No scleral icterus. Extraocular muscles intact.  HEENT: Head atraumatic, normocephalic. Oropharynx and nasopharynx clear.  NECK:  Supple, no jugular venous distention. No thyroid enlargement, no tenderness.  LUNGS: Bilateral coarse breath sounds with rales and rhonchi and crepitations CARDIOVASCULAR: S1, S2 normal. No murmurs, rubs, or gallops.  ABDOMEN: Soft, nontender, nondistended. Bowel sounds present.  EXTREMITIES: No cyanosis, clubbing or edema b/l.    NEUROLOGIC: Altered PSYCHIATRIC: The patient is drowzy SKIN: No obvious rash, lesion, or ulcer.   LABORATORY PANEL:   CBC Recent Labs  Lab 12/28/18 1035  WBC 4.8  HGB 11.7*  HCT 36.8  PLT 185    ------------------------------------------------------------------------------------------------------------------ Chemistries  Recent Labs  Lab 01/02/2019 1237 12/28/18 1035  NA 138 139  K 4.3 3.7  CL 100 106  CO2 31 29  GLUCOSE 83 96  BUN 14 13  CREATININE 0.87 1.04*  CALCIUM 8.4* 8.1*  AST 30  --   ALT 9  --   ALKPHOS 104  --   BILITOT 0.3  --    ------------------------------------------------------------------------------------------------------------------  Cardiac Enzymes Recent Labs  Lab 12/28/2018 1237  TROPONINI <0.03   ------------------------------------------------------------------------------------------------------------------  RADIOLOGY:  Dg Chest 2 View  Result Date: 12/20/2018 CLINICAL DATA:  Decreased oxygen saturation.  Cough. EXAM: CHEST - 2 VIEW COMPARISON:  August 10, 2017 FINDINGS: There is airspace opacity in the right lower lobe consistent with pneumonia. There is mild atelectatic change in the left lower lobe. Lungs elsewhere are clear. Heart size and pulmonary vascularity are normal. No adenopathy. No bone lesions. IMPRESSION: Airspace consolidation, felt to represent pneumonia, right lower lobe. Left base atelectasis present. Heart size normal. No evident adenopathy. Followup PA and lateral chest radiographs recommended in 3-4 weeks following trial of antibiotic therapy to ensure resolution and exclude underlying malignancy. Electronically Signed   By: Bretta Bang III M.D.   On: 12/16/2018 13:33     ASSESSMENT AND PLAN:   * Acute metabolic encephalopathy over chronic dementia due to acute illness-severe hypotension from severe sepsis Status post 1 L IV fluid bolus and patient is getting second liter bolus with no significant improvement.  After second bolus will continue IV fluids normal saline 125 mill per hour Transferring the patient to intensive care unit for  IV pressors Intensivist consult placed and paged on-call intensivist and  awaiting callback.  Discussed with Judeth Porch, PA in intensive care unit he is aware of the consult Stat chest x-ray ordered Blood cultures, urine cultures ordered Lactic acid and procalcitonin ordered Continue current IV antibiotics Zosyn plus vancomycin and Tamiflu   *Acute hypoxic respiratory failure secondary to influenza A infection and bilateral pneumonia with severe sepsis Tamiflu will be continued IV abx as mentioned above Bolus NS stat and start IVF. Blood cultures collected from 01/08/2019 with no growth so far Nebs scheduled ordered. Continue IV Solumedrol Acutely ill and will need stabilizing Echocardiogram  *History of Down syndrome nonverbal at baseline Currently encephalopathic  *Chronic seizure disorder Stable Continue home AEDs, seizure precautions  All the records are reviewed and case discussed with Care Management/Social Workerr. Management plans discussed with the patient brother Timat bedside, he is in agreement with the current plan of care and they are in agreement.  CCU charge nurse Beth and patient's floor nurse Meade Maw are present during my entire discussion and encounter  CODE STATUS: DNR/DNI  Sister and Brother are joint HCPOA. Discussed with sister at bedside. Patient is DNR/DNI  DVT Prophylaxis: SCDs  TOTAL CRITICAL CARE TIME TAKING CARE OF THIS PATIENT: 42 minutes.  High mortality and morbidity Prognosis guarded at this time  POSSIBLE D/C IN ?DAYS, DEPENDING ON CLINICAL CONDITION.  Ramonita Lab M.D on 12/29/2018 at 12:42 AM  Between 7am to 6pm - Pager - [phone]  After 6pm go to [email or web address] - password [email or web address] [facility]  [facility] [place] [facility]  Office  [phone]  CC: Primary care physician; Lauro Regulus, MD  Note: This dictation was prepared with Dragon dictation along with smaller phrase technology. Any transcriptional errors that result from this process are unintentional.

## 2018-12-29 NOTE — Progress Notes (Signed)
Received verbal order from Dr. Amado Coe for one view chest x ray for SOB. Dr. Amado Coe coming to bedside.

## 2018-12-29 NOTE — Evaluation (Signed)
Clinical/Bedside Swallow Evaluation Patient Details  Name: Hannah Gardner MRN: [id number] Date of Birth: Mar 21, 1966  Today's Date: 12/29/2018 Time: SLP Start Time (ACUTE ONLY): 1102 SLP Stop Time (ACUTE ONLY): 1202 SLP Time Calculation (min) (ACUTE ONLY): 60 min  Past Medical History:  Past Medical History:  Diagnosis Date  . Abnormal uterine bleeding (AUB)   . Anxiety   . Cleft lip   . Complication of anesthesia    pt was given 2 shots for sedation for a past surgery that made pt aggressive.  Mom went back with pt for last surgery, pt did well.  . Dementia (HCC)   . Dementia (HCC)   . Depression   . Down's syndrome   . Hypsarrhythmia (HCC)   . Irregular periods/menstrual cycles   . Seizures (HCC)    last episode 07/19/16.  2-3x/mo   Past Surgical History:  Past Surgical History:  Procedure Laterality Date  . CERUMEN REMOVAL Bilateral 04/18/2015   Procedure: CERUMEN REMOVAL, Bilateral ;  Surgeon: Geanie Logan, MD;  Location: [facility];  Service: ENT;  Laterality: Bilateral;  . CERUMEN REMOVAL Bilateral 08/05/2016   Procedure: CERUMEN REMOVAL;  Surgeon: Geanie Logan, MD;  Location: [facility];  Service: ENT;  Laterality: Bilateral;  . EAR EXAMINATION UNDER ANESTHESIA Bilateral    remove cerumen from ears  . ESOPHAGOGASTRODUODENOSCOPY (EGD) WITH PROPOFOL N/A 02/24/2018   Procedure: ESOPHAGOGASTRODUODENOSCOPY (EGD) WITH PROPOFOL;  Surgeon: Toledo, Boykin Nearing, MD;  Location: [facility];  Service: Gastroenterology;  Laterality: N/A;  . TOOTH EXTRACTION     HPI:  Pt is a 53 y.o. 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 In the emergency room, patient was noted to have fever, coughing, chest x-ray noted for pneumonia, influenza A positive. Pt is now been admitted for acute hypoxic  respiratory failure secondary to community-acquired pneumonia and influenza A infection.    Assessment / Plan / Recommendation Clinical Impression  Pt does have baseline dxs of Down's Syndrome, Dysphagia, and Dementia. She appears to present w/ Moderate+ oropharyngeal phase dysphagia c/b oral phase deficits of decreased awareness of bolus material, prolonged A-P transit time, munching and tongue forward position and min anterior spillage. With time and cues, pt was able to munch and transfer the bolus material from the trials for swallowing. Time and a mostly dry spoon for tactile stim for pt to complete her pharyngeal swallowing which was audible intermittently. No overt s/s of aspiration were noted; no decline in O2 sats or decline in respiratory presentation during/post trials. Suspect a delayed pharyngeal swallow initiation which was noted in the MBSS in 2019. Pt required full feeding assistance and verbal/tactile cues; attempts were made to minimize distractions during po intake. Due to pt's baseline diet of pureed w/ Nectar liquids, recommend continuing the Dysphagia level 1 diet w/ Nectar liquids via TSP(baseline per Sister) w/ aspiration precautions; feeding support; pills Crushed in Puree. ST services will be available for f/u and monitoring of oral diet. NSG updated. Sister agreed.  SLP Visit Diagnosis: Dysphagia, oropharyngeal phase (R13.12)    Aspiration Risk  Moderate aspiration risk;Risk for inadequate nutrition/hydration    Diet Recommendation  Dysphagia level 1 (PUREE) w/ NECTAR consistency liquids VIA TSP; aspiration precautions; feeding assistance at all meals. Reduce distractions at meals.   Medication Administration: Crushed with puree(for safe swallowing)    Other  Recommendations Recommended Consults: (Dietician f/u) Oral Care Recommendations: Oral care BID;Staff/trained caregiver to provide oral care Other Recommendations: Order thickener from pharmacy;Remove water  pitcher;Prohibited food (jello, ice cream, thin soups);Have oral suction available   Follow up Recommendations None(pt appears at her baseline per Sister)      Frequency and Duration min 2x/week  1 week       Prognosis Prognosis for Safe Diet Advancement: Fair Barriers to Reach Goals: Cognitive deficits;Time post onset;Severity of deficits      Swallow Study   General Date of Onset: 01-01-2019 HPI: Pt is a 53 y.o. 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 In the emergency room, patient was noted to have fever, coughing, chest x-ray noted for pneumonia, influenza A positive. Pt is now been admitted for acute hypoxic respiratory failure secondary to community-acquired pneumonia and influenza A infection.  Type of Study: Bedside Swallow Evaluation Previous Swallow Assessment: MBSS in 01/2018 Diet Prior to this Study: Dysphagia 1 (puree);Thin liquids(baseline) Temperature Spikes Noted: No(wbc 5.7) Respiratory Status: Nasal cannula(5 liters) History of Recent Intubation: No Behavior/Cognition: Cooperative;Pleasant mood;Confused;Impulsive;Distractible;Requires cueing;Doesn't follow directions(baseline Down's Syndrome and Dementia) Oral Cavity Assessment: (difficult to determine) Oral Care Completed by SLP: Recent completion by staff Oral Cavity - Dentition: Missing dentition Vision: (n/a) Self-Feeding Abilities: Total assist Patient Positioning: Upright in bed(needed full positioning) Baseline Vocal Quality: (phonations only) Volitional Cough: Cognitively unable to elicit Volitional Swallow: Unable to elicit    Oral/Motor/Sensory Function Overall Oral Motor/Sensory Function: Generalized oral weakness(difficult to assess d/t Cognition )   Ice Chips Ice chips: Not tested   Thin Liquid Thin  Liquid: Not tested    Nectar Thick Nectar Thick Liquid: Impaired Presentation: Spoon(baseline; 6 trials) Oral Phase Impairments: Reduced labial seal;Reduced lingual movement/coordination;Poor awareness of bolus(mod) Oral phase functional implications: Prolonged oral transit;Oral residue(min anterior spillage) Pharyngeal Phase Impairments: Suspected delayed Swallow Other Comments: no overt s/s of aspiration noted   Honey Thick Honey Thick Liquid: Not tested   Puree Puree: Impaired Presentation: Spoon(baseline fed; 5 trials (4ozs of magic cup prior)) Oral Phase Impairments: Reduced labial seal;Reduced lingual movement/coordination;Impaired mastication;Poor awareness of bolus Oral Phase Functional Implications: Prolonged oral transit;Oral residue;Oral holding(min anterior spillage) Pharyngeal Phase Impairments: Suspected delayed Swallow Other Comments: no overt s/s of aspiration noted during/post po's   Solid     Solid: Not tested      Hannah Som, MS, CCC-SLP Hannah Gardner 12/29/2018,3:10 PM

## 2018-12-29 NOTE — Progress Notes (Signed)
*  PRELIMINARY RESULTS* Echocardiogram 2D Echocardiogram has been performed.  Cristela Blue 12/29/2018, 9:39 AM

## 2018-12-29 NOTE — Significant Event (Signed)
Rapid Response Event Note  Overview: Time Called: 2356 Arrival Time: 1200 Event Type: Hypotension  Initial Focused Assessment:Pt in bed, resting. BP with maps in 50s. Other vs wnl.    Interventions:Chest xray, transfer to stepdown  Plan of Care (if not transferred):  Event Summary: Name of Physician Notified: Gouru at 2359    at    Outcome: Transferred (Comment)  Event End Time: 0025  Mayda Shippee A

## 2018-12-29 NOTE — Progress Notes (Signed)
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 Admitted with Influenza and pneumonia. Rx with Vancomycin/Zosyn and Tamiflu. Now hypotensive with BP = 60/50 and Is being transferred to the ICU. PCCCM asked to assume care. VSS  eICU Interventions  No new orders.      Intervention Category Evaluation Type: New Patient Evaluation  Lenell Antu 12/29/2018, 1:46 AM

## 2018-12-29 NOTE — Progress Notes (Signed)
Pharmacy Antibiotic Note  Hannah Gardner is a 53 y.o. female admitted on 18-Jan-2019 with pneumonia.  Pharmacy has been consulted for Vancomycin and Zosyn dosing. Patient is Influenza A (+)   Plan: Zosyn 3.375 gm IV Q8H EI ordered to start on 2/17 @ 2200.  Vancomycin 1250 mg IV X ordered 2/17 @ 2200.  Will adjust vancomycin dosing due to change in Scr.  Vancomycin 1000 mg IV Q 36 hrs. Goal AUC 400-550. Expected AUC: 571 SCr used: 1.04   Height: 4\' 8"  (142.2 cm) Weight: 104 lb 1.6 oz (47.2 kg) IBW/kg (Calculated) : 36.3  Temp (24hrs), Avg:99.5 F (37.5 C), Min:97.7 F (36.5 C), Max:102.8 F (39.3 C)  Recent Labs  Lab 01/18/2019 1237 2019/01/18 2216 12/28/18 1035 12/29/18 0209  WBC 10.1  --  4.8  --   CREATININE 0.87  --  1.04*  --   LATICACIDVEN  --  1.0  --  3.7*    Estimated Creatinine Clearance: 40.7 mL/min (A) (by C-G formula based on SCr of 1.04 mg/dL (H)).    Allergies  Allergen Reactions  . Lamotrigine Rash    Rash on torso and arms    Antimicrobials this admission:  2/17 Azithro/CTX >> x 1 dose 2/17 Vancomycin>> 2/17 Zosyn>> 2/19 cefepime 2/17 Oseltamivir (T[place]fu) >>   Microbiology results: BCx: pending  Sputum:  pending MRSA PCR: pending Influenza A (+)   Thank you for allowing pharmacy to be a part of this patient's care.  Erich Montane, PharmD, BCPS Clinical Pharmacist 12/29/2018 3:38 AM

## 2018-12-29 NOTE — Consult Note (Signed)
Name: Hannah Gardner MRN: [id number] DOB: 02/17/1966    ADMISSION DATE:  12/16/2018 CONSULTATION DATE:  12/29/2018  REFERRING MD :  Dr. Amado Coe  CHIEF COMPLAINT:  Hypotension  BRIEF PATIENT DESCRIPTION:  53 y.o. Female, DNR/DNI, with PMH of Down's Syndrome, Seizures, and Dementia who was initially admitted to [facility] Med-surg with Acute Hypoxic Respiratory Failure and Sepsis secondary to Influenza A and Community Acquired Pneumonia.  Early in the morning 12/29/2018 pt with hypotension requiring transfer to Stepdown.  Concern for Hypovolemic shock with worsening sepsis and septic shock.  SIGNIFICANT EVENTS  12/23/2018>> Admission to [facility] Med-surg unit 12/29/2018>> Transfer to Stepdown due to hypotension  STUDIES:  Echocardiogram 12/29/2018>>  CULTURES: Influenza PCR 12/26/2018>> Positive Influenza A Blood x2 12/25/2018>> No growth to date MRSA PCR 12/29/18>> Sputum 12/29/18>> Blood x2  12/29/18>> Urine 12/29/18>> Strep Pneumo urinary antigen 12/29/18>> Legionella urinary antigen 12/29/18>>  ANTIBIOTICS: Azithromycin 2/17>>2/17 Rocephin 2/17>>2/17 Tamiflu 2/17>> Zosyn 2/17>>2/19 Vancomycin 2/17>> Cefepime 2/19>> Flagyl 2/19>>  HISTORY OF PRESENT ILLNESS:   Hannah Gardner is a 53 year old female with a past medical history of Down syndrome (nonverbal at baseline), autism, seizures, dementia, anxiety, and depression who initially presented to [facility] ED on 12/30/2018 with complaints of cough, low grade fever, and decreased level of activity. Upon presentation to the ED she was noted to be febrile and hypoxic (O2 sats in 80's). Chest x-ray was concerning for pneumonia, and she was also positive for influenza A.  She was admitted to [facility] MedSurg unit treatment for treatment of acute hypoxic respiratory failure and sepsis secondary to community-acquired pneumonia and influenza A.  Early in the morning on 12/29/2018, she became hypotensive, requiring transfer to stepdown unit.  While on the floor she received 2 L  normal saline bolus (30 cc/kg), with noted improvement in blood pressure.  Procalcitonin has increased to 2.93 (previously 0.56), and lactic acid increased to 3.7 (previously 1.0).  CXR is also concerning for worsening RLL consolidation.  Concern is for hypovolemic shock plus worsening sepsis and septic shock.   PCCM is consulted for further management.  PAST MEDICAL HISTORY :   has a past medical history of Abnormal uterine bleeding (AUB), Anxiety, Cleft lip, Complication of anesthesia, Dementia (HCC), Dementia (HCC), Depression, Down's syndrome, Hypsarrhythmia (HCC), Irregular periods/menstrual cycles, and Seizures (HCC).  has a past surgical history that includes Ear examination under anesthesia (Bilateral); Cerumen removal (Bilateral, 04/18/2015); Tooth extraction; Cerumen removal (Bilateral, 08/05/2016); and Esophagogastroduodenoscopy (egd) with propofol (N/A, 02/24/2018). Prior to Admission medications   Medication Sig Start Date End Date Taking? Authorizing Provider  ammonium lactate (AMLACTIN) 12 % cream Apply topically as needed for dry skin. 08/11/18  Yes Defrancesco, Prentice Docker, MD  azelastine (ASTELIN) 0.1 % nasal spray Place 1 spray into both nostrils 2 (two) times daily.   Yes [provider]  calamine lotion Apply 1 application topically as needed for itching.   Yes [provider]  calcium carbonate (OS-CAL - DOSED IN MG OF ELEMENTAL CALCIUM) 1250 (500 Ca) MG tablet Take 1 tablet by mouth 2 (two) times daily.    Yes [provider]  diphenhydrAMINE (BENADRYL) 12.5 MG/5ML liquid Take 12.5 mg by mouth 4 (four) times daily as needed for itching or allergies.   Yes [provider]  divalproex (DEPAKOTE SPRINKLE) 125 MG capsule Take 375 mg by mouth 2 (two) times daily.    Yes [provider]  Emollient (DML FORTE) CREA Apply 1 application topically daily.    Yes [provider]  escitalopram (LEXAPRO)  10 MG tablet Take 5-10 mg by mouth See admin  instructions. Take  tablet (5mg ) by mouth every morning and 1 tablet (10mg ) by mouth every afternoon at 1700   Yes [provider]  hydrocerin (EUCERIN) CREA Apply 1 application topically daily.    Yes [provider]  hydrocortisone 2.5 % cream Apply 1 application topically 2 (two) times daily as needed (vulvular redness).    Yes [provider]  hydrogen peroxide 1.5 % SOLN Place 4 drops into both ears See admin instructions. Administer 4 drops in affected ear(s) and rinse with water twice monthly   Yes [provider]  levETIRAcetam (KEPPRA) 500 MG tablet Take 1,250 mg by mouth 2 (two) times daily.    Yes [provider]  loratadine (CLARITIN) 10 MG tablet Take 10 mg by mouth daily.    Yes [provider]  LORazepam (ATIVAN) 0.5 MG tablet Take 0.5 mg by mouth 3 (three) times daily as needed for seizure.   Yes [provider]  LORazepam (ATIVAN) 1 MG tablet Take 1 mg by mouth at bedtime.   Yes [provider]  memantine (NAMENDA XR) 28 MG CP24 24 hr capsule Take 28 mg by mouth daily.    Yes [provider]  Multiple Vitamin (MULTIVITAMIN WITH MINERALS) TABS tablet Take 1 tablet by mouth daily.   Yes [provider]  NONFORMULARY OR COMPOUNDED ITEM Apply 1 application topically every 2 (two) hours. **Touchless Zinc Spray** Spray onto gluteal and sacral region every 2 hours with changing   Yes [provider]  nystatin ointment (MYCOSTATIN) Apply 1 application topically 2 (two) times daily. 07/26/18  Yes Defrancesco, Prentice DockerMartin A, MD  Pseudoeph-Chlorphen-DM [facility]([place] COUGH/COLD) 15-1-5 MG/5ML LIQD Take 10 mLs by mouth every 6 (six) hours as needed (cold symptoms).    Yes [provider]  rivastigmine (EXELON) 13.3 MG/24HR Place 13.3 mg onto the skin daily.    Yes [provider]  senna (SENOKOT) 8.6 MG tablet Take 2 tablets by mouth daily as needed for constipation.    Yes [provider]  Talc (BABY POWDER EX) Apply 1 application topically daily as needed (for moisture protection).   Yes [provider]  VIMPAT 200 MG TABS tablet Take 200 mg by mouth 2 (two) times daily.    Yes [provider]  levETIRAcetam (KEPPRA) 500 MG tablet Take 3 tablets (1,500 mg total) by mouth 2 (two) times daily. Patient not taking: Reported on 10-Oct-2019 05/09/16   Emily FilbertWilliams, Jonathan E, MD  nystatin-triamcinolone [facility](MYCOLOG II) cream Apply 1 application topically 2 (two) times daily. For 10-14 days Patient not taking: Reported on 10-Oct-2019 07/14/18   Defrancesco, Prentice DockerMartin A, MD  triamcinolone ointment (KENALOG) 0.1 % Apply 1 application topically 2 (two) times daily. Patient not taking: Reported on 10-Oct-2019 07/26/18   Defrancesco, Prentice DockerMartin A, MD   Allergies  Allergen Reactions  . Lamotrigine Rash    Rash on torso and arms    FAMILY HISTORY:  family history is not on file. SOCIAL HISTORY:  reports that she has never smoked. She has never used smokeless tobacco. She reports that she does not drink alcohol or use drugs.  REVIEW OF SYSTEMS:   Unable to assess due to underlying Down' Syndrome and Dementia  SUBJECTIVE:  Unable to assess due to underlying Down's Syndrome and Dementia On 5L Nasal Cannula  VITAL SIGNS: Temp:  [97.7 F (36.5 C)-102.8 F (39.3 C)] 98.4 F (36.9 C) (02/18 2244) Pulse Rate:  [61-117]  61 (02/18 2347) Resp:  [17-19] 19 (02/18 1956) BP: (60-117)/(45-94) 60/50 (02/18 2347) SpO2:  [84 %-99 %] 93 % (02/18 2329)  PHYSICAL EXAMINATION: General:  Acutely ill appearing female, laying in bed, on Nasal cannula, in NAD Neuro:  Awake, purposeful movements but not to command, moans (nonverbal at baseline) HEENT:  Atraumatic, normocephalic, neck supple, no JVD Cardiovascular:  RRR, s1s2, no M/R/G Lungs:  Coarse breath sounds bilaterally to auscultation, even, nonlabored, normal effort Abdomen:  Soft, nontender, nondistended, no guarding or rebound  tenderness, BS+ x4 Musculoskeletal:  No deformities, no edema Skin:  Warm, dry.  No obvious rashes, lesions, or ulcerations  Recent Labs  Lab 2019-01-21 1237 12/28/18 1035  NA 138 139  K 4.3 3.7  CL 100 106  CO2 31 29  BUN 14 13  CREATININE 0.87 1.04*  GLUCOSE 83 96   Recent Labs  Lab 01/21/19 1237 12/28/18 1035  HGB 12.9 11.7*  HCT 39.7 36.8  WBC 10.1 4.8  PLT 229 185   Dg Chest 2 View  Result Date: 21-Jan-2019 CLINICAL DATA:  Decreased oxygen saturation.  Cough. EXAM: CHEST - 2 VIEW COMPARISON:  August 10, 2017 FINDINGS: There is airspace opacity in the right lower lobe consistent with pneumonia. There is mild atelectatic change in the left lower lobe. Lungs elsewhere are clear. Heart size and pulmonary vascularity are normal. No adenopathy. No bone lesions. IMPRESSION: Airspace consolidation, felt to represent pneumonia, right lower lobe. Left base atelectasis present. Heart size normal. No evident adenopathy. Followup PA and lateral chest radiographs recommended in 3-4 weeks following trial of antibiotic therapy to ensure resolution and exclude underlying malignancy. Electronically Signed   By: Bretta Bang III M.D.   On: 01/21/2019 13:33    ASSESSMENT / PLAN:  Acute Hypoxic Respiratory Failure in setting of Influenza A and Community Acquired Pneumonia -CXR 2/19 with worsening RLL Consolidation -Supplemental O2 as needed to maintain O2 sats >92% -Follow intermittent CXR and ABG as needed -Continue Vancomycin; will discontinue Zosyn, add Cefepime and Flagyl  -Continue Tamiflu -Obtain Sputum culture, Strep Pneumo and Legionella urinary antigens -Continue Duonebs and Solu-Medrol  Hypotension, Hypovolemic shock + Septic shock +/- Cardiogenic shock -Cardiac monitoring -Maintain MAP >65 -Received 2L NS bolus (30 cc/kg) 2/19 on floor with noted improvement in BP -IVF: NS @ 85 ml/hr -Neo-synephrine if needed to maintain MAP goal -Trend Troponin -Obtain  Echocardiogram  Severe sepsis secondary to Influenza A and Pneumonia Lactic Acidosis -Monitor fever curve -Trend WBC's and Procalcitonin -Trend Lactic acid -Blood cultures performed on 2/17 negative to date -Will repeat Pan cultures -Continue Vancomycin; discontinue Zosyn, add Cefepime and Flagyl -Continue Tamiflu -IVF -Consider ID consult  AKI, likely in setting of dehydration -Monitor I&O's / urinary output -Follow BMP -Ensure adequate renal perfusion -Avoid nephrotoxic agents as able -Replace electrolytes as indicated -IVF  Acute Metabolic Encephalopathy in setting of Sepsis Hx: Seizures, Down's Syndrome and nonverbal at baseline, Dementia, Depression, Anxiety -Provide supportive care -Promote normal sleep/wake cycle -Treat Sepsis -Continue home Depakote, Lexapro, Vimpat, Keprra, Namenda, Rivastigmine  Anemia without signs of active bleeding -Monitor for S/Sx of bleeding -Trend CBC -Lovenox for VTE Prophylaxis  -Transfuse for Hgb <7   DISPOSITION: Stepdown GOALS OF CARE: DNR/DNI VTE PROPHYLAXIS: Lovenox UPDATES: Updated pt's brother Jorja Loa Advertising account planner) at bedside 12/29/2018.  He confirms she is DNR/DNI.  Harlon Ditty, AGACNP-BC [facility] Pager: [phone] Cell: [phone]  12/29/2018, 12:44 AM

## 2018-12-29 NOTE — Progress Notes (Signed)
Primary RN Meade Maw asked this nurse for assistance for assessment of patient, BP found to be 60/30, HR 29, family at bedside. Patient responsive to voice, lung sounds are rhonchi throughout both. Called a rapid response for this patient, explained concern to the gentleman (who stated he was the legal guardian) at bedside the severity of the patient's BP. Upon explaining to RRT, the family member stated he wanted to "complain" to whomever he could. The family member stated there were too many staff in the room, assisted Katia primary RN, requested Dr. Amado Coe on call prime physician to assess the patient as well.

## 2018-12-29 NOTE — Progress Notes (Signed)
Pt non verbal at baseline. Pt blood pressure 82/45. MD notified. Verbal order given to this nurse for 1L normal saline bolus. Bolus given, blood pressure rechecked. Found to be 79/50 with MAP of 55. Rechecked at 2347 and found to be 60/50. Sleeping with increased rhonci in both lungs. Nurse Judeth Cornfield called for further evaluation, rapid response called. During rapid response, the patient's medical POA was present, quite agitated and wanted to know who was in charge because he wanted to make some complaints. MD notified of RRT, verbal order of chest x-ray. MD at bedside, explained condition to pt/family member and received verbal permission for patient to transfer to ICU and tomorrow receive an echocardiogram.

## 2018-12-30 ENCOUNTER — Inpatient Hospital Stay: Payer: Medicare Other

## 2018-12-30 DIAGNOSIS — Q909 Down syndrome, unspecified: Secondary | ICD-10-CM

## 2018-12-30 DIAGNOSIS — Z7189 Other specified counseling: Secondary | ICD-10-CM

## 2018-12-30 DIAGNOSIS — J189 Pneumonia, unspecified organism: Secondary | ICD-10-CM

## 2018-12-30 DIAGNOSIS — R451 Restlessness and agitation: Secondary | ICD-10-CM

## 2018-12-30 LAB — CBC
HEMATOCRIT: 36.6 % (ref 36.0–46.0)
Hemoglobin: 11 g/dL — ABNORMAL LOW (ref 12.0–15.0)
MCH: 34.5 pg — ABNORMAL HIGH (ref 26.0–34.0)
MCHC: 30.1 g/dL (ref 30.0–36.0)
MCV: 114.7 fL — ABNORMAL HIGH (ref 80.0–100.0)
Platelets: 176 10*3/uL (ref 150–400)
RBC: 3.19 MIL/uL — ABNORMAL LOW (ref 3.87–5.11)
RDW: 14.9 % (ref 11.5–15.5)
WBC: 10.6 10*3/uL — ABNORMAL HIGH (ref 4.0–10.5)
nRBC: 0 % (ref 0.0–0.2)

## 2018-12-30 LAB — BASIC METABOLIC PANEL
ANION GAP: 4 — AB (ref 5–15)
BUN: 14 mg/dL (ref 6–20)
CO2: 28 mmol/L (ref 22–32)
Calcium: 8.1 mg/dL — ABNORMAL LOW (ref 8.9–10.3)
Chloride: 110 mmol/L (ref 98–111)
Creatinine, Ser: 0.86 mg/dL (ref 0.44–1.00)
GFR calc Af Amer: >60 mL/min (ref >60)
GFR calc non Af Amer: >60 mL/min (ref >60)
Glucose, Bld: 97 mg/dL (ref 70–99)
POTASSIUM: 4.7 mmol/L (ref 3.5–5.1)
Sodium: 142 mmol/L (ref 135–145)

## 2018-12-30 LAB — LEGIONELLA PNEUMOPHILA SEROGP 1 UR AG: L. pneumophila Serogp 1 Ur Ag: NEGATIVE

## 2018-12-30 LAB — URINE CULTURE
Culture: NO GROWTH
Special Requests: NORMAL

## 2018-12-30 LAB — GLUCOSE, CAPILLARY: Glucose-Capillary: 89 mg/dL (ref 70–99)

## 2018-12-30 LAB — PROCALCITONIN: Procalcitonin: 1.95 ng/mL

## 2018-12-30 MED ORDER — BUDESONIDE 0.5 MG/2ML IN SUSP: 0.5000 mg | Freq: Once | RESPIRATORY_TRACT | Status: DC

## 2018-12-30 MED ORDER — VALPROATE SODIUM 500 MG/5ML IV SOLN
250.0000 mg | Freq: Three times a day (TID) | INTRAVENOUS | Status: DC
  Administered 2018-12-30 – 2018-12-31 (×3): 250 mg via INTRAVENOUS
  Filled 2018-12-30 (×7): qty 2.5

## 2018-12-30 MED ORDER — DEXAMETHASONE SODIUM PHOSPHATE 4 MG/ML IJ SOLN
10.0000 mg | Freq: Once | INTRAMUSCULAR | Status: AC
  Administered 2018-12-30: 10 mg via INTRAVENOUS

## 2018-12-30 MED ORDER — ENOXAPARIN SODIUM 30 MG/0.3ML ~~LOC~~ SOLN
30.0000 mg | SUBCUTANEOUS | Status: DC
  Administered 2018-12-30: 30 mg via SUBCUTANEOUS
  Filled 2018-12-30: qty 0.3

## 2018-12-30 MED ORDER — DEXAMETHASONE SODIUM PHOSPHATE 4 MG/ML IJ SOLN
4.0000 mg | Freq: Four times a day (QID) | INTRAMUSCULAR | Status: DC
  Administered 2018-12-30: 4 mg via INTRAVENOUS
  Filled 2018-12-30: qty 1

## 2018-12-30 MED ORDER — MORPHINE SULFATE (PF) 2 MG/ML IV SOLN
INTRAVENOUS | Status: AC
  Filled 2018-12-30: qty 1

## 2018-12-30 MED ORDER — LORAZEPAM 2 MG/ML IJ SOLN
0.5000 mg | INTRAMUSCULAR | Status: DC | PRN
  Administered 2018-12-30 – 2018-12-31 (×3): 0.5 mg via INTRAVENOUS
  Filled 2018-12-30 (×4): qty 1

## 2018-12-30 MED ORDER — RACEPINEPHRINE HCL 2.25 % IN NEBU
0.5000 mL | INHALATION_SOLUTION | Freq: Once | RESPIRATORY_TRACT | Status: AC
  Administered 2018-12-30: 0.5 mL via RESPIRATORY_TRACT

## 2018-12-30 MED ORDER — RACEPINEPHRINE HCL 2.25 % IN NEBU: 0.5000 mL | INHALATION_SOLUTION | RESPIRATORY_TRACT | Status: DC | PRN

## 2018-12-30 MED ORDER — RACEPINEPHRINE HCL 2.25 % IN NEBU
INHALATION_SOLUTION | RESPIRATORY_TRACT | Status: AC
  Administered 2018-12-30: 0.5 mL via RESPIRATORY_TRACT
  Filled 2018-12-30: qty 0.5

## 2018-12-30 MED ORDER — BUDESONIDE 0.25 MG/2ML IN SUSP
0.2500 mg | Freq: Two times a day (BID) | RESPIRATORY_TRACT | Status: DC
  Administered 2018-12-30 – 2018-12-31 (×3): 0.25 mg via RESPIRATORY_TRACT
  Filled 2018-12-30 (×3): qty 2

## 2018-12-30 MED ORDER — MORPHINE SULFATE (PF) 2 MG/ML IV SOLN
1.0000 mg | INTRAVENOUS | Status: DC | PRN
  Administered 2018-12-30 – 2018-12-31 (×4): 2 mg via INTRAVENOUS
  Filled 2018-12-30 (×4): qty 1

## 2018-12-30 MED ORDER — SODIUM CHLORIDE 0.9 % IV SOLN
200.0000 mg | Freq: Two times a day (BID) | INTRAVENOUS | Status: DC
  Administered 2018-12-31: 200 mg via INTRAVENOUS
  Filled 2018-12-30 (×5): qty 20

## 2018-12-30 MED ORDER — CHLORHEXIDINE GLUCONATE CLOTH 2 % EX PADS
6.0000 | MEDICATED_PAD | Freq: Every day | CUTANEOUS | Status: DC
  Administered 2018-12-31: 6 via TOPICAL

## 2018-12-30 MED ORDER — MUPIROCIN 2 % EX OINT
1.0000 "application " | TOPICAL_OINTMENT | Freq: Two times a day (BID) | CUTANEOUS | Status: DC
  Administered 2018-12-30 (×2): 1 via NASAL
  Filled 2018-12-30: qty 22

## 2018-12-30 MED ORDER — SODIUM CHLORIDE 0.9 % IV SOLN
1250.0000 mg | Freq: Two times a day (BID) | INTRAVENOUS | Status: DC
  Administered 2018-12-30 – 2018-12-31 (×2): 1250 mg via INTRAVENOUS
  Filled 2018-12-30 (×5): qty 12.5

## 2018-12-30 MED ORDER — MORPHINE SULFATE (PF) 2 MG/ML IV SOLN
2.0000 mg | Freq: Once | INTRAVENOUS | Status: AC
  Administered 2018-12-30: 2 mg via INTRAVENOUS

## 2018-12-30 NOTE — Progress Notes (Signed)
Pt developed acute respiratory distress with increased work of breathing, retractions, and hypoxia (O2 sats in 80's).  Upon auscultation, pt with severe inspiratory and expiratory wheezing as well as Stridor.  Pt given 10 mg Decadron, Racemic Epi, and Budesonide neb. Pt to be placed on BiPAP.  It is noted, pt had received DuoNeb treatment and IV Morphine shortly before development of respiratory distress.  Discussed with pt's brother Jorja Loa, and he feels that it developed after DuoNeb, and possible that it could have broken up some mucus with possible aspiration.  Pt's other vital signs (HR, BP, temperatue) remain stable, so highly doubt this is Anaphylactic reaction to either the morphine or DuoNeb.  CXR was obtained which shows continued worsening of right lung aeration with new, small pleural effusion.  Will also place order for X-ray of Neck.  Pt's O2 saturations have improved to 93%, and work of breathing has improved slightly.  Discussed with pt's brother Jorja Loa.  Continue to monitor.    Harlon Ditty, AGACNP-BC [place] [facility] Pager: [phone] Cell: [phone]

## 2018-12-30 NOTE — Progress Notes (Signed)
[employer] [facility] [place] [facility]   PATIENT NAME: Hannah Gardner    MR#:  [id number]  DATE OF BIRTH:  28-Mar-1966  SUBJECTIVE:  CHIEF COMPLAINT:   Chief Complaint  Patient presents with  . Cough   Drowzy. Febrile. Tachycardia.  Sister at bedside.  Total care baseline. Non verbal but can let needs know to family. Today she is confused Transferred to ICU due to Hypotension. Due to worsening respi condition- requiring HFNC. Given Mprphin to help anxiety and agitation. REVIEW OF SYSTEMS:    Review of Systems  Unable to perform ROS: Dementia    DRUG ALLERGIES:   Allergies  Allergen Reactions  . Lamotrigine Rash    Rash on torso and arms    VITALS:  Blood pressure 123/66, pulse 60, temperature 98.5 F (36.9 C), temperature source Axillary, resp. rate 18, height 4\' 8"  (1.422 m), weight 47.2 kg, SpO2 95 %.  PHYSICAL EXAMINATION:   Physical Exam  GENERAL:  53 y.o.-year-old patient lying in the bed .  EYES: Pupils equal, round, reactive to light and accommodation. No scleral icterus. Extraocular muscles intact.  HEENT: Head atraumatic, normocephalic. Oropharynx and nasopharynx clear.  NECK:  Supple, no jugular venous distention. No thyroid enlargement, no tenderness.  LUNGS: Bilateral coarse breath sounds CARDIOVASCULAR: S1, S2 normal. No murmurs, rubs, or gallops.  ABDOMEN: Soft, nontender, nondistended. Bowel sounds present. No organomegaly or mass.  EXTREMITIES: No cyanosis, clubbing or edema b/l.    NEUROLOGIC: Not following instructions PSYCHIATRIC: The patient is drowzy SKIN: No obvious rash, lesion, or ulcer.   LABORATORY PANEL:   CBC Recent Labs  Lab 12/30/18 0618  WBC 10.6*  HGB 11.0*  HCT 36.6  PLT 176   ------------------------------------------------------------------------------------------------------------------ Chemistries  Recent Labs  Lab 2019/01/08 1237  12/30/18 0618  NA 138   < > 142  K 4.3   < > 4.7  CL 100   < > 110   CO2 31   < > 28  GLUCOSE 83   < > 97  BUN 14   < > 14  CREATININE 0.87   < > 0.86  CALCIUM 8.4*   < > 8.1*  AST 30  --   --   ALT 9  --   --   ALKPHOS 104  --   --   BILITOT 0.3  --   --    < > = values in this interval not displayed.   ------------------------------------------------------------------------------------------------------------------  Cardiac Enzymes Recent Labs  Lab 12/29/18 0432  TROPONINI <0.03   ------------------------------------------------------------------------------------------------------------------  RADIOLOGY:  Dg Neck Soft Tissue  Result Date: 12/30/2018 CLINICAL DATA:  Respiratory distress. EXAM: NECK SOFT TISSUES - 1+ VIEW COMPARISON:  None. FINDINGS: The examination is somewhat degraded by positioning. The epiglottis is poorly visualized due to overlying structures; however, it is not thickened. There is minimal retropharyngeal soft tissue thickening. Multilevel cervical degenerative disc disease. Upper trachea is difficult to assess on the lateral view but is unremarkable on the AP projection. IMPRESSION: Examination degraded by positioning. No thickening of the epiglottis. Minimal retropharyngeal thickening. Electronically Signed   By: Deatra Robinson M.D.   On: 12/30/2018 05:02   Dg Chest Port 1 View  Result Date: 12/30/2018 CLINICAL DATA:  Acute respiratory distress EXAM: PORTABLE CHEST 1 VIEW COMPARISON:  12/29/2018 FINDINGS: Worsened aeration of the right lung with new small pleural effusion. Bilateral airspace opacities are otherwise unchanged. Cardiomediastinal contours are unchanged. IMPRESSION: Continued worsening of right lung aeration with new, small pleural effusion.  Electronically Signed   By: Deatra Robinson M.D.   On: 12/30/2018 04:24   Dg Chest Port 1 View  Result Date: 12/29/2018 CLINICAL DATA:  Cough EXAM: PORTABLE CHEST 1 VIEW COMPARISON:  01/05/2019 FINDINGS: Worsening consolidation of the right lower lobe. Aeration in the left lung  is also worsened with mild volume loss of the lingula. No pleural effusion or pneumothorax. Unchanged cardiomediastinal contours. Rightward deviation of the trachea may indicate thyroid goiter. IMPRESSION: Increased right lower lobe consolidation consistent with pneumonia. Worsening left lung aeration. Electronically Signed   By: Deatra Robinson M.D.   On: 12/29/2018 01:00     ASSESSMENT AND PLAN:   *Acute hypoxic respiratory failure secondary to influenza A infection and bilateral pneumonia with severe sepsis Tamiflu IV abx Bolus NS stat and start IVF. Cx pending Nebs scheduled ordered. Will also start solumedrol Acutely ill and will need stabilizing  transferred to ICU due to hypotension. Due to worsening respi status- Intencivist suggest to NOT use Bipap as she may not tolerate and advised to involve palliative/ Hospice care.  * Acute metabolic encephalopathy over chronic dementia due to acute illness  *Chronic seizure disorder Stable Continue home AEDs, seizure precautions  All the records are reviewed and case discussed with Care Management/Social Workerr. Management plans discussed with the patient, family and they are in agreement.  CODE STATUS: DNR/DNI  Sister and Brother are joint HCPOA. Discussed with sister at bedside. Patient is DNR/DNI  DVT Prophylaxis: SCDs  TOTAL CRITICAL CARE TIME TAKING CARE OF THIS PATIENT: 35 minutes.   POSSIBLE D/C IN 2-3 DAYS, DEPENDING ON CLINICAL CONDITION. Family too meet palliative care to make further decisions.  Altamese Dilling M.D on 12/30/2018 at 10:44 PM  Between 7am to 6pm - Pager - [phone]  After 6pm go to [email or web address] - password [email or web address] [facility]  [facility]  Office  [phone]  CC: Primary care physician; Lauro Regulus, MD  Note: This dictation was prepared with Dragon dictation along with smaller phrase technology. Any transcriptional errors that result from this process are unintentional.

## 2018-12-30 NOTE — Progress Notes (Signed)
Pt switched to [facility] @ 15 lpm due to desat & stridorous resps.

## 2018-12-30 NOTE — Progress Notes (Signed)
[facility] -  at [facility]   PATIENT NAME: Hannah Gardner    MR#:  [id number]  DATE OF BIRTH:  1966-06-15  SUBJECTIVE:  CHIEF COMPLAINT:   Chief Complaint  Patient presents with  . Cough   Drowzy. Febrile. Tachycardia.  Sister at bedside.  Total care baseline. Non verbal but can let needs know to family. Today she is confused Transferred to ICU due to Hypotension. REVIEW OF SYSTEMS:    Review of Systems  Unable to perform ROS: Dementia    DRUG ALLERGIES:   Allergies  Allergen Reactions  . Lamotrigine Rash    Rash on torso and arms    VITALS:  Blood pressure 123/66, pulse 60, temperature 98.5 F (36.9 C), temperature source Axillary, resp. rate 18, height 4\' 8"  (1.422 m), weight 47.2 kg, SpO2 95 %.  PHYSICAL EXAMINATION:   Physical Exam  GENERAL:  53 y.o.-year-old patient lying in the bed .  EYES: Pupils equal, round, reactive to light and accommodation. No scleral icterus. Extraocular muscles intact.  HEENT: Head atraumatic, normocephalic. Oropharynx and nasopharynx clear.  NECK:  Supple, no jugular venous distention. No thyroid enlargement, no tenderness.  LUNGS: Bilateral coarse breath sounds CARDIOVASCULAR: S1, S2 normal. No murmurs, rubs, or gallops.  ABDOMEN: Soft, nontender, nondistended. Bowel sounds present. No organomegaly or mass.  EXTREMITIES: No cyanosis, clubbing or edema b/l.    NEUROLOGIC: Not following instructions PSYCHIATRIC: The patient is drowzy SKIN: No obvious rash, lesion, or ulcer.   LABORATORY PANEL:   CBC Recent Labs  Lab 12/30/18 0618  WBC 10.6*  HGB 11.0*  HCT 36.6  PLT 176   ------------------------------------------------------------------------------------------------------------------ Chemistries  Recent Labs  Lab 01-05-2019 1237  12/30/18 0618  NA 138   < > 142  K 4.3   < > 4.7  CL 100   < > 110  CO2 31   < > 28  GLUCOSE 83   < > 97  BUN 14   < > 14  CREATININE 0.87   < > 0.86  CALCIUM  8.4*   < > 8.1*  AST 30  --   --   ALT 9  --   --   ALKPHOS 104  --   --   BILITOT 0.3  --   --    < > = values in this interval not displayed.   ------------------------------------------------------------------------------------------------------------------  Cardiac Enzymes Recent Labs  Lab 12/29/18 0432  TROPONINI <0.03   ------------------------------------------------------------------------------------------------------------------  RADIOLOGY:  Dg Neck Soft Tissue  Result Date: 12/30/2018 CLINICAL DATA:  Respiratory distress. EXAM: NECK SOFT TISSUES - 1+ VIEW COMPARISON:  None. FINDINGS: The examination is somewhat degraded by positioning. The epiglottis is poorly visualized due to overlying structures; however, it is not thickened. There is minimal retropharyngeal soft tissue thickening. Multilevel cervical degenerative disc disease. Upper trachea is difficult to assess on the lateral view but is unremarkable on the AP projection. IMPRESSION: Examination degraded by positioning. No thickening of the epiglottis. Minimal retropharyngeal thickening. Electronically Signed   By: Deatra Robinson M.D.   On: 12/30/2018 05:02   Dg Chest Port 1 View  Result Date: 12/30/2018 CLINICAL DATA:  Acute respiratory distress EXAM: PORTABLE CHEST 1 VIEW COMPARISON:  12/29/2018 FINDINGS: Worsened aeration of the right lung with new small pleural effusion. Bilateral airspace opacities are otherwise unchanged. Cardiomediastinal contours are unchanged. IMPRESSION: Continued worsening of right lung aeration with new, small pleural effusion. Electronically Signed   By: Deatra Robinson M.D.   On: 12/30/2018 04:24  Dg Chest Port 1 View  Result Date: 12/29/2018 CLINICAL DATA:  Cough EXAM: PORTABLE CHEST 1 VIEW COMPARISON:  12/22/2018 FINDINGS: Worsening consolidation of the right lower lobe. Aeration in the left lung is also worsened with mild volume loss of the lingula. No pleural effusion or pneumothorax.  Unchanged cardiomediastinal contours. Rightward deviation of the trachea may indicate thyroid goiter. IMPRESSION: Increased right lower lobe consolidation consistent with pneumonia. Worsening left lung aeration. Electronically Signed   By: Deatra Robinson M.D.   On: 12/29/2018 01:00     ASSESSMENT AND PLAN:   *Acute hypoxic respiratory failure secondary to influenza A infection and bilateral pneumonia with severe sepsis Tamiflu IV abx Bolus NS stat and start IVF. Cx pending Nebs scheduled ordered. Will also start solumedrol Acutely ill and will need stabilizing  transferred to ICU due to hypotension.  * Acute metabolic encephalopathy over chronic dementia due to acute illness  *Chronic seizure disorder Stable Continue home AEDs, seizure precautions  All the records are reviewed and case discussed with Care Management/Social Workerr. Management plans discussed with the patient, family and they are in agreement.  CODE STATUS: DNR/DNI  Sister and Brother are joint HCPOA. Discussed with sister at bedside. Patient is DNR/DNI  DVT Prophylaxis: SCDs  TOTAL CRITICAL CARE TIME TAKING CARE OF THIS PATIENT: 35 minutes.   POSSIBLE D/C IN 2-3 DAYS, DEPENDING ON CLINICAL CONDITION.  Altamese Dilling M.D on 12/30/2018 at 10:41 PM  Between 7am to 6pm - Pager - [phone]  After 6pm go to [email or web address] - password [email or web address] [facility]  [facility] [place] [facility]  Office  [phone]  CC: Primary care physician; Lauro Regulus, MD  Note: This dictation was prepared with Dragon dictation along with smaller phrase technology. Any transcriptional errors that result from this process are unintentional.

## 2018-12-30 NOTE — Progress Notes (Signed)
Rough day. Oxygen saturations up and down. Given Morphine x 2 for difficulty breathing. Oxygen now on 80% high flow.

## 2018-12-30 NOTE — Progress Notes (Signed)
eLink Physician-Brief Progress Note Patient Name: Lacrecia Dahan DOB: [birth year] MRN: [id number]   Date of Service  12/30/2018  HPI/Events of Note  Camera: Discussed with bed side RN via phone.  Received dexa/duoneb-/racemic neb and going on BiPAP for wheezing, stridor post morphine IV .  VS stable. Rt setting up for the biPAP. DNR.   eICU Interventions  --BiPAP with asp risk. Moderate risk. Keep NPO while on BiPAP tonight - get VBG - continue abx, antiviral. Notes, labs reviewed. Getting cxR.      Intervention Category Major Interventions: Respiratory failure - evaluation and management  Ranee Gosselin 12/30/2018, 4:05 AM

## 2018-12-30 NOTE — Progress Notes (Signed)
SLP Cancellation Note  Patient Details Name: Hannah Gardner MRN: [id number] DOB: 04-02-1966   Cancelled treatment:       Reason Eval/Treat Not Completed: Patient not medically ready;Fatigue/lethargy limiting ability to participate;Medical issues which prohibited therapy(chart reviewed; consulted NSG then talked w/ Sister). Pt had a difficult respiratory distress event early at ~4:00AM. She has since been sleeping not awaking to tactile/verbal stim to safely take po's.  Recommend holding any oral intake until pt is fully alert/awake and able to safely participate to take po's; dysphagia diet is still in place per chart notes.  ST services will f/u w/ pt's status next 1-2 days. Strict aspiration precautions. NSG agreed.    Jerilynn Som, MS, CCC-SLP Smantha Boakye 12/30/2018, 1:46 PM

## 2018-12-30 NOTE — Progress Notes (Addendum)
Increased work of breathing overnight last night improved with low-dose morphine.  Now on high flow oxygen.  Blood pressure has stabilized.  Presently somnolent and I did not awaken her.  Vitals:   12/30/18 1134 12/30/18 1200 12/30/18 1404 12/30/18 1414  BP:  (!) 120/97    Pulse:  67    Resp:  19    Temp:  97.8 F (36.6 C)    TempSrc:      SpO2: 97% 93% 95% 97%  Weight:      Height:      HFNC 60%  Somnolent Mildly labored breathing pattern HEENT: NCAT, sclerae white Neck supple, no JVD noted Few scattered wheezes, dependent crackles Regular, no M NABS, NT Extremities warm, no edema  BMP Latest Ref Rng & Units 12/30/2018 12/29/2018 12/28/2018  Glucose 70 - 99 mg/dL 97 357(S) 96  BUN 6 - 20 mg/dL 14 16 13   Creatinine 0.44 - 1.00 mg/dL 1.77 9.39(Q) 3.00(P)  Sodium 135 - 145 mmol/L 142 143 139  Potassium 3.5 - 5.1 mmol/L 4.7 3.9 3.7  Chloride 98 - 111 mmol/L 110 112(H) 106  CO2 22 - 32 mmol/L 28 24 29   Calcium 8.9 - 10.3 mg/dL 8.1(L) 7.6(L) 8.1(L)    CBC Latest Ref Rng & Units 12/30/2018 12/29/2018 12/28/2018  WBC 4.0 - 10.5 K/uL 10.6(H) 5.7 4.8  Hemoglobin 12.0 - 15.0 g/dL 11.0(L) 10.6(L) 11.7(L)  Hematocrit 36.0 - 46.0 % 36.6 34.5(L) 36.8  Platelets 150 - 400 K/uL 176 168 185    CXR: Diffuse bilateral airspace disease  IMPRESSION: Down syndrome with severe cognitive impairment Acute hypoxemic respiratory failure Influenza pneumonitis Possible superimposed bacterial pneumonia Hypotension, resolved Mild AKI, resolving Seizure disorder DNR/DNI  PLAN/REC: Continue supplemental oxygen to maintain SPO2 >90% Continue oseltamivir Continue empiric antibacterial antibiotics Monitor BMET intermittently Monitor I/Os Correct electrolytes as indicated Low-dose morphine as needed for respiratory distress Low-dose lorazepam as needed for anxiety/agitation Change anticonvulsants to IV as she is presently unable to take medications orally  I had a detailed discussion with  patient's sister who is healthcare power of attorney.  We agree that our highest priority here is to ensure that patient does not suffer in any way.  I recommend to other care providers that we not use BiPAP under any circumstances as I do not think that she will tolerate it and it will create more discomfort than it relieves.  Billy Fischer, MD PCCM service Mobile [phone] Pager [phone] 12/30/2018 3:10 PM

## 2018-12-30 NOTE — Progress Notes (Signed)
Pharmacy Antibiotic Note  Hannah Gardner is a 53 y.o. female admitted on 2019-01-18 with pneumonia.  Pharmacy has been consulted for Vancomycin dosing. Patient is Influenza A (+). Patient is also on Ceftriaxone 1g IV q24h and Oseltamivir 30mg  PO BID.  Plan: Continue Vancomycin 1000 mg IV Q 36 hrs. Goal AUC 400-550. Expected AUC: 571 SCr used: 1.04   Height: 4\' 8"  (142.2 cm) Weight: 104 lb 1.6 oz (47.2 kg) IBW/kg (Calculated) : 36.3  Temp (24hrs), Avg:97.9 F (36.6 C), Min:97.2 F (36.2 C), Max:98.6 F (37 C)  Recent Labs  Lab January 18, 2019 1237 2019-01-18 2216 12/28/18 1035 12/29/18 0209 12/29/18 0432 12/30/18 0618  WBC 10.1  --  4.8  --  5.7 10.6*  CREATININE 0.87  --  1.04*  --  1.03* 0.86  LATICACIDVEN  --  1.0  --  3.7* 2.9*  --     Estimated Creatinine Clearance: 49.2 mL/min (by C-G formula based on SCr of 0.86 mg/dL).    Allergies  Allergen Reactions  . Lamotrigine Rash    Rash on torso and arms    Antimicrobials this admission:  2/17 Azithro/CTX >> x 1 dose 2/17 Vancomycin>> 2/17 Zosyn>> 2/19 cefepime 2/17 Oseltamivir ([place]) >>   Microbiology results: BCx: pending  Sputum:  pending MRSA PCR: pending Influenza A (+)   Thank you for allowing pharmacy to be a part of this patient's care.  Clovia Cuff, PharmD, BCPS 12/30/2018 12:15 PM

## 2018-12-31 ENCOUNTER — Inpatient Hospital Stay: Payer: Medicare Other

## 2018-12-31 MED ORDER — LORAZEPAM 2 MG/ML IJ SOLN: 1.0000 mg | INTRAMUSCULAR | Status: DC | PRN

## 2018-12-31 MED ORDER — BUDESONIDE 0.5 MG/2ML IN SUSP
0.5000 mg | Freq: Once | RESPIRATORY_TRACT | Status: AC
  Administered 2018-12-31: 0.5 mg via RESPIRATORY_TRACT
  Filled 2018-12-31: qty 2

## 2018-12-31 MED ORDER — MORPHINE SULFATE (PF) 2 MG/ML IV SOLN
2.0000 mg | INTRAVENOUS | Status: DC | PRN
  Administered 2018-12-31 (×2): 2 mg via INTRAVENOUS
  Filled 2018-12-31 (×2): qty 1

## 2018-12-31 MED ORDER — DEXAMETHASONE SODIUM PHOSPHATE 4 MG/ML IJ SOLN
10.0000 mg | Freq: Once | INTRAMUSCULAR | Status: AC
  Administered 2018-12-31: 10 mg via INTRAVENOUS

## 2018-12-31 MED ORDER — RACEPINEPHRINE HCL 2.25 % IN NEBU
0.5000 mL | INHALATION_SOLUTION | Freq: Once | RESPIRATORY_TRACT | Status: AC
  Administered 2018-12-31: 0.5 mL via RESPIRATORY_TRACT
  Filled 2018-12-31: qty 0.5

## 2018-12-31 MED ORDER — MORPHINE SULFATE (PF) 2 MG/ML IV SOLN
2.0000 mg | Freq: Once | INTRAVENOUS | Status: AC
  Administered 2018-12-31: 2 mg via INTRAVENOUS

## 2018-12-31 MED ORDER — MORPHINE SULFATE (PF) 2 MG/ML IV SOLN: 1.0000 mg | INTRAVENOUS | Status: DC | PRN

## 2019-01-01 LAB — CULTURE, BLOOD (ROUTINE X 2)
Culture: NO GROWTH
Culture: NO GROWTH
SPECIAL REQUESTS: ADEQUATE
Special Requests: ADEQUATE

## 2019-01-03 ENCOUNTER — Telehealth: Payer: Self-pay | Admitting: Pulmonary Disease

## 2019-01-03 LAB — CULTURE, BLOOD (ROUTINE X 2)
Culture: NO GROWTH
Culture: NO GROWTH
Special Requests: ADEQUATE

## 2019-01-03 NOTE — Telephone Encounter (Signed)
Death Certificate received, placed in DS box for signing.

## 2019-01-05 NOTE — Telephone Encounter (Signed)
Death certificate complete. Rich and thompson aware.

## 2019-01-09 NOTE — Progress Notes (Signed)
   12/26/2018 0900  Clinical Encounter Type  Visited With Family  Visit Type Initial;Spiritual support;Patient actively dying  Referral From Chaplain  Consult/Referral To Chaplain  Spiritual Encounters  Spiritual Needs Prayer;Emotional;Grief support  Stress Factors  Family Stress Factors Loss;Major life changes;Other (Comment)  Chaplain was rounding and patient was on comfort care. Chaplain entered room and gave emotional and spiritual support. Chaplain offered prayer to family. Sister wanted Chaplain to pray that her sister does not be in pain through her transition. Chaplain gave family time to be with love and offered family to contact Chaplain if needed before transition.

## 2019-01-09 NOTE — Progress Notes (Signed)
Events of last night noted.  Upon my arrival this morning patient was in moderate distress despite having received morphine.  Patient's brother was at bedside.  I spoke with him and we agreed that it is time to provide full comfort care.  At this point, she is comfortable receiving only morphine and lorazepam.  No further medical interventions will be undertaken.  Billy Fischer, MD PCCM service Mobile [phone] Pager [phone] 12/14/2018 4:40 PM

## 2019-01-09 NOTE — Progress Notes (Signed)
CDS contacted.  Spoke with Hannah Gardner.  Not available for any donation.  Referral number [phone]

## 2019-01-09 NOTE — Progress Notes (Signed)
   12/24/2018 1000  Clinical Encounter Type  Visited With Family  Visit Type Initial;Spiritual support  Referral From Chaplain  Consult/Referral To Chaplain  Spiritual Encounters  Spiritual Needs Prayer;Grief support  Stress Factors  Family Stress Factors Loss;Major life changes   Chaplain received a referral from previous on-call chaplain to visit with the family in the wake of the patient being made comfort care only. Chaplains Trena Dunavan (self) and Sibyl Parr arrived to the room where the patient was resting peacefully with her sister (primary caregiver) at the bedside. The patient's sister was tearful and expressed a desire that the patient would transition peacefully. Prayer offered for the aforementioned as well as peace and comfort for the family at this difficult time. Patient's brother entered the room and thanked the chaplains for their support. The family was welcomed to call or page a chaplain if needed. Will follow up.

## 2019-01-09 NOTE — Discharge Summary (Signed)
DEATH SUMMARY  DATE OF ADMISSION:  Jan 12, 2019  DATE OF DISCHARGE/DEATH: 01-16-19   ADMISSION DIAGNOSES:   Acute hypoxemic respiratory failure Influenza pneumonia Suspected bacterial CAP Severe Down's syndrome Dementia Chronic seizure disorder DNR   DISCHARGE DIAGNOSES:   Acute hypoxemic respiratory failure Influenza pneumonia Suspected bacterial CAP Severe Down's syndrome Dementia Chronic seizure disorder Severe sepsis with hypotension   PRESENTATION:   Pt was admitted by hospitalist service via [facility] ED with the following HPI and the above admission diagnoses:  Jack Ristau  is a 53 y.o. female with a known history per below presenting from local group home, was seen in congenital clinic, found to be hypoxic with O2 saturation into the 80s, in the emergency room patient was noted to have fever, coughing, chest x-ray noted for pneumonia, influenza A positive, patient evaluated in the emergency room per family members present, patient is poor historian due to Down's dementia, patient is now been admitted for acute hypoxic respiratory failure secondary to community-acquired pneumonia and influenza A infection. DNR/DNI was established at the time of admission.  PAST MEDICAL HISTORY:   Diagnosis  . Abnormal uterine bleeding (AUB)  . Anxiety  . Cleft lip  . Dementia (HCC)  . Depression  . Down's syndrome  . Hypsarrhythmia (HCC)  . Irregular periods/menstrual cycles  . Seizures [facility]     HOSPITAL COURSE:   She was admitted to the MedSurg floor under the hospitalist service and treated with supplemental oxygen, oseltamivir, ceftriaxone, azithromycin.  Her chronic anticonvulsants were continued.  In the early morning of 2/19 she developed hypotension and was transferred to the SDU.  Blood pressure improved after volume resuscitation.  Hypotension was thought to be due to severe sepsis.  She was noted to have mild acute kidney injury which did improve somewhat after volume  resuscitation.  Echocardiogram was performed which was essentially normal.  On the evening of 2/20 she developed progressive respiratory distress with hypoxemia and was treated with intermittent BiPAP which she did not tolerate well.  On the morning of Jan 16, 2023 it was evident that she was in significant distress.  Dr. Sung Amabile spoke with both her sister and brother who had been closely involved in care provision over the duration of her life.  It was agreed that we should focus on providing comfort.  Morphine and lorazepam as needed were provided.  She was extremely comfortable over the last several hours and eventually passed away peacefully.  No autopsy was performed.    Cause of death:  Influenza pneumonia  Contributing factors: Down's syndrome  Autopsy:  No  Smoking:  No   Billy Fischer, MD PCCM service Mobile [phone] Pager [phone] 01-16-19 6:27 PM

## 2019-01-09 NOTE — Progress Notes (Signed)
Patient has increased work of breathing and difficult to keep oxygen saturation above 90% on HFNC at 15L. Bipap(10/5, 100%, R10) put on patient and tidal volumes less than . Nasal tracheal suction performed on patient. Obtained a small amount of bloody secretions. Put patient back on Bipap and tidal volumes increased tp . Shortly after, patient stopped initiating breaths. Ambu bag was then applied. Family at bedside. Then patient placed on a non-rebreather. Patient transitioning to comfort care per MD orders.

## 2019-01-09 NOTE — Progress Notes (Signed)
[employer] - [place] at [facility]   PATIENT NAME: Hannah Gardner    MR#:  [id number]  DATE OF BIRTH:  1966-01-08  SUBJECTIVE:  CHIEF COMPLAINT:   Chief Complaint  Patient presents with  . Cough   Drowzy. Febrile. Tachycardia.  Sister at bedside.  Total care baseline. Non verbal but can let needs know to family. Today she is confused Transferred to ICU due to Hypotension. Due to worsening respi condition- requiring HFNC. Given Mprphin to help anxiety and agitation. Family has agreed on comfort care today. REVIEW OF SYSTEMS:    Review of Systems  Unable to perform ROS: Dementia    DRUG ALLERGIES:   Allergies  Allergen Reactions  . Lamotrigine Rash    Rash on torso and arms    VITALS:  Blood pressure 120/61, pulse 84, temperature 97.7 F (36.5 C), temperature source Axillary, resp. rate 10, height 4\' 8"  (1.422 m), weight 47.2 kg, SpO2 92 %.  PHYSICAL EXAMINATION:   Physical Exam  GENERAL:  53 y.o.-year-old patient lying in the bed .  EYES: Pupils equal, round, reactive to light and accommodation. No scleral icterus. Extraocular muscles intact.  HEENT: Head atraumatic, normocephalic. Oropharynx and nasopharynx clear.  NECK:  Supple, no jugular venous distention. No thyroid enlargement, no tenderness.  LUNGS: Bilateral coarse breath sounds CARDIOVASCULAR: S1, S2 normal. No murmurs, rubs, or gallops.  ABDOMEN: Soft, nontender, nondistended. Bowel sounds present. No organomegaly or mass.  EXTREMITIES: No cyanosis, clubbing or edema b/l.    NEUROLOGIC: Not following instructions PSYCHIATRIC: The patient is drowzy SKIN: No obvious rash, lesion, or ulcer.   LABORATORY PANEL:   CBC Recent Labs  Lab 12/30/18 0618  WBC 10.6*  HGB 11.0*  HCT 36.6  PLT 176   ------------------------------------------------------------------------------------------------------------------ Chemistries  Recent Labs  Lab 2019-01-14 1237  12/30/18 0618  NA 138   < > 142   K 4.3   < > 4.7  CL 100   < > 110  CO2 31   < > 28  GLUCOSE 83   < > 97  BUN 14   < > 14  CREATININE 0.87   < > 0.86  CALCIUM 8.4*   < > 8.1*  AST 30  --   --   ALT 9  --   --   ALKPHOS 104  --   --   BILITOT 0.3  --   --    < > = values in this interval not displayed.   ------------------------------------------------------------------------------------------------------------------  Cardiac Enzymes Recent Labs  Lab 12/29/18 0432  TROPONINI <0.03   ------------------------------------------------------------------------------------------------------------------  RADIOLOGY:  Dg Neck Soft Tissue  Result Date: 12/30/2018 CLINICAL DATA:  Respiratory distress. EXAM: NECK SOFT TISSUES - 1+ VIEW COMPARISON:  None. FINDINGS: The examination is somewhat degraded by positioning. The epiglottis is poorly visualized due to overlying structures; however, it is not thickened. There is minimal retropharyngeal soft tissue thickening. Multilevel cervical degenerative disc disease. Upper trachea is difficult to assess on the lateral view but is unremarkable on the AP projection. IMPRESSION: Examination degraded by positioning. No thickening of the epiglottis. Minimal retropharyngeal thickening. Electronically Signed   By: Deatra Robinson M.D.   On: 12/30/2018 05:02   Dg Chest Port 1 View  Result Date: 12/30/2018 CLINICAL DATA:  Acute respiratory distress EXAM: PORTABLE CHEST 1 VIEW COMPARISON:  12/29/2018 FINDINGS: Worsened aeration of the right lung with new small pleural effusion. Bilateral airspace opacities are otherwise unchanged. Cardiomediastinal contours are unchanged. IMPRESSION: Continued worsening of right  lung aeration with new, small pleural effusion. Electronically Signed   By: Deatra Robinson M.D.   On: 12/30/2018 04:24     ASSESSMENT AND PLAN:   *Acute hypoxic respiratory failure secondary to influenza A infection and bilateral pneumonia with severe sepsis Tamiflu IV abx Bolus NS  stat and start IVF. Cx pending Nebs scheduled ordered. Will also start solumedrol Acutely ill and will need stabilizing  transferred to ICU due to hypotension. Due to worsening respi status- Intencivist suggest to NOT use Bipap as she may not tolerate and advised to involve palliative/ Hospice care. Family agreed on comfort care.  * Acute metabolic encephalopathy over chronic dementia due to acute illness  *Chronic seizure disorder Stable Continue home AEDs, seizure precautions  *Down syndrome and cognitive impairment at baseline  All the records are reviewed and case discussed with Care Management/Social Workerr. Management plans discussed with the patient, family and they are in agreement.  CODE STATUS: DNR/DNI  Sister and Brother are joint HCPOA. Discussed with sister at bedside. Patient is DNR/DNI  DVT Prophylaxis: SCDs  TOTAL CRITICAL CARE TIME TAKING CARE OF THIS PATIENT: 35 minutes.  On comfort care, talked to patient's brother and sister in the room.  Altamese Dilling M.D on 01-07-19 at 6:48 PM  Between 7am to 6pm - Pager - [phone]  After 6pm go to [email or web address] - password [email or web address] [facility]  S[facility]  Hospitalists  Office  [phone]  CC: Primary care physician; Lauro Regulus, MD  Note: This dictation was prepared with Dragon dictation along with smaller phrase technology. Any transcriptional errors that result from this process are unintentional.

## 2019-01-09 NOTE — Progress Notes (Signed)
Patient continues to be on HFNC with recurrent episodes of moaning and agitation. Around 2 am patient developed respiratory distress with increased work of breathing, retractions and hypoxia in low 80's. NP and RT at bedside and new orders received. Patient is retaining urine and orders for PRN in/out cath received. Family at bedside and updated on plan of care. BP and HR WNL.

## 2019-01-09 DEATH — deceased

## 2019-02-02 LAB — MISCELLANEOUS TEST

## 2019-12-02 ENCOUNTER — Encounter: Payer: Medicare Other | Admitting: Obstetrics and Gynecology
